# Patient Record
Sex: Male | Born: 1960 | Race: Black or African American | Hispanic: No | Marital: Single | Smoking: Never smoker
Health system: Southern US, Community
[De-identification: ages and names within clinical notes are randomized; demographics above are authoritative.]

## PROBLEM LIST (undated history)

## (undated) ENCOUNTER — Emergency Department: Admission: EM

## (undated) DIAGNOSIS — I10 Essential (primary) hypertension: Secondary | ICD-10-CM

## (undated) DIAGNOSIS — E119 Type 2 diabetes mellitus without complications: Secondary | ICD-10-CM

## (undated) DIAGNOSIS — E7800 Pure hypercholesterolemia, unspecified: Secondary | ICD-10-CM

## (undated) DIAGNOSIS — F79 Unspecified intellectual disabilities: Secondary | ICD-10-CM

## (undated) HISTORY — DX: Essential (primary) hypertension: I10

---

## 2013-05-16 ENCOUNTER — Ambulatory Visit: Payer: Self-pay | Admitting: Gastroenterology

## 2018-01-22 ENCOUNTER — Encounter

## 2018-01-22 ENCOUNTER — Encounter: Payer: Medicare Other | Attending: Family Medicine | Admitting: Nutrition

## 2018-01-22 ENCOUNTER — Encounter: Payer: Self-pay | Admitting: Nutrition

## 2018-01-22 VITALS — Ht 63.0 in | Wt 173.0 lb

## 2018-01-22 DIAGNOSIS — E1165 Type 2 diabetes mellitus with hyperglycemia: Secondary | ICD-10-CM | POA: Insufficient documentation

## 2018-01-22 DIAGNOSIS — I10 Essential (primary) hypertension: Secondary | ICD-10-CM | POA: Insufficient documentation

## 2018-01-22 DIAGNOSIS — E782 Mixed hyperlipidemia: Secondary | ICD-10-CM | POA: Diagnosis present

## 2018-01-22 DIAGNOSIS — E669 Obesity, unspecified: Secondary | ICD-10-CM | POA: Insufficient documentation

## 2018-01-22 DIAGNOSIS — E118 Type 2 diabetes mellitus with unspecified complications: Secondary | ICD-10-CM | POA: Insufficient documentation

## 2018-01-22 DIAGNOSIS — IMO0002 Reserved for concepts with insufficient information to code with codable children: Secondary | ICD-10-CM

## 2018-01-22 NOTE — Progress Notes (Signed)
  Medical Nutrition Therapy:  Appt start time: 1400 end time:  1500.   Assessment:  Primary concerns today: DM Type 2. Lives by himself. PMH: Hyperlipidemia, HTN Eats 3 meals per day. No transportation. Has an aide that helps him with meals and medications. Sees Dr. Durene Cal, [facility]. H/o possible TIA. Lantus 38 units a day- takes in am. , Glimepiride 4 mg a day. Testing once in morning. FBS 120-224 mg/dl. Last A1C 8.9%.  Admits to snacks between meals and eating sweets, misc junk food. Quit alcohol within the last year.  Current diet exceeds his needs contributing to elevated BS. Stays hungry between meals at times. Had 1 low  BS of 48 in December from skipping breakfast.  Willing to eat better to cut out snacks and improve BS. Marland Kitchen  Preferred Learning Style:   Auditory  Visual  Hands on  Learning Readiness:    Ready  Change in progress   MEDICATIONS:   DIETARY INTAKE:   24-hr recall:  B ( AM):  Eggs,-2  toast- 1  . Coffee plain Snk ( AM): pb sandwich 1 slice bread L ( PM):  String beans, potatoes, boiled chicken, water Snk ( PM): popcorn  D ( PM): cabbage, meatloaf, water Snk ( PM):  Beverages: water,   Usual physical activity: ADL   Estimated energy needs: 1800 calories 200  g carbohydrates 135 g protein 50 g fat  Progress Towards Goal(s):  In progress.   Nutritional Diagnosis:  NB-1.1 Food and nutrition-related knowledge deficit As related to DM Type .  As evidenced by A1C 8.9%.    Intervention:  Nutrition and Diabetes education provided on My Plate, CHO counting, meal planning, portion sizes, timing of meals, avoiding snacks between meals unless having a low blood sugar, target ranges for A1C and blood sugars, signs/symptoms and treatment of hyper/hypoglycemia, monitoring blood sugars, taking medications as prescribed, benefits of exercising 30 minutes per day and prevention of complications of DM. Marland Kitchen Goals 1. Follow MY Plate 2. Eat B) 6-8 am,   Lunch 12-2 pm  and Dinner 5-7 pm. Don't eat past 7 pm Cut out snacks between meals unless veggies.  Increase water intake  Get am BS less than 130 and Before bed less than 150 mg/dl. Walk daily Get A1C down to 7%   Teaching Method Utilized:  Visual Auditory Hands on  Handouts given during visit include:  The Plate Method   Meal Plan Card   Barriers to learning/adherence to lifestyle change: none  Demonstrated degree of understanding via:  Teach Back   Monitoring/Evaluation:  Dietary intake, exercise, , and body weight in 1 month(s). Recommend to consider stopping GLimperide- d/t weight gain and hunger/Low blood sugar and add Januvuia instead for better blood sugar control.

## 2018-01-22 NOTE — Patient Instructions (Addendum)
Goals 1. Follow MY Plate 2. Eat B) 6-8 am,   Lunch 12-2 pm and Dinner 5-7 pm. Don't eat past 7 pm Cut out snacks between meals unless veggies.  Increase water intake  Get am BS less than 130 and Before bed less than 150 mg/dl. Walk daily Get A1C down to 7%

## 2018-02-13 ENCOUNTER — Ambulatory Visit: Payer: Medicare Other | Admitting: Nutrition

## 2018-02-19 ENCOUNTER — Encounter: Payer: Medicare Other | Attending: Internal Medicine | Admitting: Nutrition

## 2018-02-19 VITALS — Ht 63.0 in | Wt 168.0 lb

## 2018-02-19 DIAGNOSIS — IMO0002 Reserved for concepts with insufficient information to code with codable children: Secondary | ICD-10-CM

## 2018-02-19 DIAGNOSIS — E118 Type 2 diabetes mellitus with unspecified complications: Secondary | ICD-10-CM | POA: Insufficient documentation

## 2018-02-19 DIAGNOSIS — E669 Obesity, unspecified: Secondary | ICD-10-CM | POA: Diagnosis present

## 2018-02-19 DIAGNOSIS — E1165 Type 2 diabetes mellitus with hyperglycemia: Secondary | ICD-10-CM

## 2018-02-19 NOTE — Progress Notes (Signed)
  Medical Nutrition Therapy:  Appt start time: 1000  end time:  1030  Assessment:  Primary concerns today: DM Type 2. Lives by himself. PMH: Hyperlipidemia, HTN Eats 3 meals per day. No transportation. Has an aide that helps him with meals and medications. Sees Dr. Durene Cal, [facility]. H/o possible TIA. Lantus 38 units a day- takes in am. , Glimepiride 4 mg a day.  Brought meter in but hasn't been testing according to the recall numbers..  Has been walking more often. Cut out snacks and junk food. Eating more fruits and vegetables. Some blood sugar readings in his meter but is different that what he wrote on his sheets. Only a few new numbers in his meter. Readjusted and corrected his times and dates on meter. He notes he is walking more. Lost 5 lbs.   Preferred Learning Style:   Auditory  Visual  Hands on  Learning Readiness:    Ready  Change in progress   MEDICATIONS:   DIETARY INTAKE:   24-hr recall:  B ( AM):  Eggs 2 boiled and 2 slice toast, water Snk ( AM) L ( PM): Cabbage, green beans, potatoes, sweet potato, chicken patty .  String beans, potatoes, boiled chicken, water Snk ( PM):  D ( PM): forgot what he ate. Beef., gravy, water Snk ( PM):  Beverages: water,   Usual physical activity: ADL   Estimated energy needs: 1800 calories 200  g carbohydrates 135 g protein 50 g fat  Progress Towards Goal(s):  In progress.   Nutritional Diagnosis:  NB-1.1 Food and nutrition-related knowledge deficit As related to DM Type .  As evidenced by A1C 8.9%.    Intervention:  Nutrition and Diabetes education provided on My Plate, CHO counting, meal planning, portion sizes, timing of meals, avoiding snacks between meals unless having a low blood sugar, target ranges for A1C and blood sugars, signs/symptoms and treatment of hyper/hypoglycemia, monitoring blood sugars, taking medications as prescribed, benefits of exercising 30 minutes per day and prevention of complications of  DM. Marland Kitchen Goals 1. Test blood in am and bedtime daily 2. Increase walking daily. 3. Increase vegetables and fresh fruits Get A1C done at next MD visit.  Teaching Method Utilized:  Visual Auditory Hands on  Handouts given during visit include:  The Plate Method   Meal Plan Card   Barriers to learning/adherence to lifestyle change: none  Demonstrated degree of understanding via:  Teach Back   Monitoring/Evaluation:  Dietary intake, exercise, , and body weight in 1 month(s). Recommend to consider stopping GLimperide- d/t weight gain and hunger/Low blood sugar and add Januvuia instead for better blood sugar control.

## 2018-02-19 NOTE — Patient Instructions (Signed)
Goals 1. Test blood in am and bedtime daily 2. Increase walking daily. 3. Increase vegetables and fresh fruits Get A1C done at next MD visit.

## 2018-02-28 ENCOUNTER — Encounter: Payer: Self-pay | Admitting: Nutrition

## 2018-05-08 ENCOUNTER — Encounter: Payer: Medicare Other | Attending: Internal Medicine | Admitting: Nutrition

## 2018-05-08 ENCOUNTER — Other Ambulatory Visit: Payer: Self-pay

## 2018-05-08 DIAGNOSIS — E1165 Type 2 diabetes mellitus with hyperglycemia: Secondary | ICD-10-CM | POA: Insufficient documentation

## 2018-05-08 DIAGNOSIS — IMO0002 Reserved for concepts with insufficient information to code with codable children: Secondary | ICD-10-CM

## 2018-05-08 DIAGNOSIS — E118 Type 2 diabetes mellitus with unspecified complications: Secondary | ICD-10-CM | POA: Insufficient documentation

## 2018-05-08 DIAGNOSIS — E782 Mixed hyperlipidemia: Secondary | ICD-10-CM | POA: Insufficient documentation

## 2018-05-08 NOTE — Progress Notes (Signed)
  Medical Nutrition Therapy:  Appt start time: 1000  end time:  1030  Assessment:  Primary concerns today: DM Type 2. FBS 238, 267, 269, 250 Evening: 383, 275, 263, 224, 280, 298. Got new meter. Accucheck Guide. Lantus 38 units Glimiperide. Has cut out sodas.  Drinks Water Eating 3 meals per day.  Has been eating fast foods. He has an aide that cooks for him. Needs better compliance with his food choices to improve his DM.   Preferred Learning Style:   Auditory  Visual  Hands on  Learning Readiness:    Ready  Change in progress   MEDICATIONS:   DIETARY INTAKE:   24-hr recall:  B ( AM):  Cherrios, with milk. Snk ( AM) L ( PM): Corn dog with bun, water, Snk ( PM):  D ( PM): Mashed potatoes, string beans, corn bread and beef,  Snk ( PM):  Beverages: water,   Usual physical activity: ADL   Estimated energy needs: 1800 calories 200  g carbohydrates 135 g protein 50 g fat  Progress Towards Goal(s):  In progress.   Nutritional Diagnosis:  NB-1.1 Food and nutrition-related knowledge deficit As related to DM Type .  As evidenced by A1C 8.9%.    Intervention:  Nutrition and Diabetes education provided on My Plate, CHO counting, meal planning, portion sizes, timing of meals, avoiding snacks between meals unless having a low blood sugar, target ranges for A1C and blood sugars, signs/symptoms and treatment of hyper/hypoglycemia, monitoring blood sugars, taking medications as prescribed, benefits of exercising 30 minutes per day and prevention of complications of DM. Marland Kitchen Goals Cut out fast foods  Increase fresh fruits and vegetables. Drink only water Follow My Plate as discussed Get labs done Call Dr. Fransico Him office and let them know your BS readings for insulin adjustment  Teaching Method Utilized:  Visual Auditory Hands on  Handouts given during visit include:  The Plate Method   Meal Plan Card   Barriers to learning/adherence to lifestyle change:  none  Demonstrated degree of understanding via:  Teach Back   Monitoring/Evaluation:  Dietary intake, exercise, , and body weight in 1 month(s).

## 2018-05-10 ENCOUNTER — Encounter: Payer: Self-pay | Admitting: Nutrition

## 2018-05-10 NOTE — Patient Instructions (Signed)
Goals Cut out fast foods  Increase fresh fruits and vegetables. Drink only water Follow My Plate as discussed Get labs done

## 2018-08-08 ENCOUNTER — Encounter: Payer: Medicare Other | Attending: Internal Medicine | Admitting: Nutrition

## 2018-08-08 ENCOUNTER — Other Ambulatory Visit: Payer: Self-pay

## 2018-08-08 NOTE — Progress Notes (Signed)
edical Nutrition Therapy:  Appt start time: 1216  end time:  1245  Assessment:  Primary concerns today: DM Type 2. Last A1C 8%. Has BP has been elevated. Admits to eating some porkl. Has been eating fried foods, BBQ Saw Dr. Yong Channel last week. At [facility] ([place]). Metformin 500 mg twice a day. Lantus 38 units, Glimperide 4 mg, His crestor increased to 40 mg a day according Was put on some new BP medicaine.  FBS: 159, 160, 173 and 213 mg/dl Drinking only water Walks for exercise. BP this am  142/84.  Needs a lot of reinforcement and coaching. He has a aide that is suppose to cook for him but she cooks what he wants or eat eats what processed foods he buys.  Preferred Learning Style:   Auditory  Visual  Hands on  Learning Readiness:    Ready  Change in progress   MEDICATIONS:   DIETARY INTAKE:   24-hr recall:  B ( AM):  viennnas sausage,  Snk ( AM) L ( PM): banana sandwich,  Water , Snk ( PM):  D ( PM):lima beans, corn, [place] wings,,  Snk ( PM):  Beverages: water,   Usual physical activity: ADL   Estimated energy needs: 1800 calories 200  g carbohydrates 135 g protein 50 g fat  Progress Towards Goal(s):  In progress.   Nutritional Diagnosis:  NB-1.1 Food and nutrition-related knowledge deficit As related to DM Type .  As evidenced by A1C 8.9%.    Intervention:  Nutrition and Diabetes education provided on My Plate, CHO counting, meal planning, portion sizes, timing of meals, avoiding snacks between meals unless having a low blood sugar, target ranges for A1C and blood sugars, signs/symptoms and treatment of hyper/hypoglycemia, monitoring blood sugars, taking medications as prescribed, benefits of exercising 30 minutes per day and prevention of complications of DM. Marland Kitchen Goals Cut out fast foods and avoid processed foods  Increase fresh fruits and vegetables. Drink only water Follow My Plate as discussed Get labs done Call Dr  office and let them know your BS readings  for insulin adjustment  Teaching Method Utilized:  Visual Auditory Hands on  Handouts given during visit include:  The Plate Method   Meal Plan Card   Barriers to learning/adherence to lifestyle change: none  Demonstrated degree of understanding via:  Teach Back   Monitoring/Evaluation:  Dietary intake, exercise, , and body weight in 3 month(s).

## 2018-09-06 ENCOUNTER — Encounter: Payer: Self-pay | Admitting: Nutrition

## 2018-09-06 NOTE — Patient Instructions (Addendum)
.   Goals Cut out fast foods and processed foods  Increase fresh fruits and vegetables. Drink only water Follow My Plate as discussed Get labs done Call Dr  office and let them know your BS readings for insulin adjustment

## 2018-11-06 ENCOUNTER — Encounter: Payer: Medicare Other | Attending: Internal Medicine | Admitting: Nutrition

## 2018-11-06 ENCOUNTER — Encounter: Payer: Self-pay | Admitting: Nutrition

## 2018-11-06 ENCOUNTER — Other Ambulatory Visit: Payer: Self-pay

## 2018-11-06 VITALS — Wt 177.0 lb

## 2018-11-06 DIAGNOSIS — E1165 Type 2 diabetes mellitus with hyperglycemia: Secondary | ICD-10-CM | POA: Diagnosis present

## 2018-11-06 DIAGNOSIS — IMO0002 Reserved for concepts with insufficient information to code with codable children: Secondary | ICD-10-CM

## 2018-11-06 DIAGNOSIS — E669 Obesity, unspecified: Secondary | ICD-10-CM | POA: Diagnosis present

## 2018-11-06 DIAGNOSIS — E118 Type 2 diabetes mellitus with unspecified complications: Secondary | ICD-10-CM | POA: Diagnosis not present

## 2018-11-06 DIAGNOSIS — E782 Mixed hyperlipidemia: Secondary | ICD-10-CM | POA: Diagnosis present

## 2018-11-06 NOTE — Patient Instructions (Signed)
Goals Cut out fast foods and avoid processed foods  Increase fresh fruits and vegetables. Drink only water Follow My Plate as discussed Get A1C done. Increase walking to 60 minutes a day Lose 1-2 lbs per week Get A1C down to 7%

## 2018-11-06 NOTE — Progress Notes (Signed)
edical Nutrition Therapy:  Appt start time: 1000  end time:  1015  Assessment:  Primary concerns today: DM Type 2. Last A1C 8% 12/19. No recent A1C. He notes he has cut out eating a lot of fried foods and fast foods. Metformin 500 mg twice a day. Lantus 38 units, Glimperide 4 mg, His crestor increased to 40 mg a day according to him last visit. Was put on some new BP medicaine.  FBS 130-180's. Drinking only water Walks for exercise.   Needs a lot of reinforcement and coaching. He has a aide that is suppose to cook for him but she cooks what he wants or eat eats what processed foods he buys.  He would benefit from an A1C level to better assess his BS control.   Preferred Learning Style:   Auditory  Visual  Hands on  Learning Readiness:    Ready  Change in progress   MEDICATIONS:   DIETARY INTAKE:   24-hr recall:  B ( AM):  Eggs and toast, Snk ( AM) L ( PM):bakled chicken, green beans and tuna salad, water , Snk ( PM):  D ( PM):tuna sandwich and fruit, water  Snk ( PM):  Beverages: water,   Usual physical activity: ADL   Estimated energy needs: 1800 calories 200  g carbohydrates 135 g protein 50 g fat  Progress Towards Goal(s):  In progress.   Nutritional Diagnosis:  NB-1.1 Food and nutrition-related knowledge deficit As related to DM Type .  As evidenced by A1C 8.9%.    Intervention:  Nutrition and Diabetes education provided on My Plate, CHO counting, meal planning, portion sizes, timing of meals, avoiding snacks between meals unless having a low blood sugar, target ranges for A1C and blood sugars, signs/symptoms and treatment of hyper/hypoglycemia, monitoring blood sugars, taking medications as prescribed, benefits of exercising 30 minutes per day and prevention of complications of DM. Marland Kitchen Goals Cut out fast foods and avoid processed foods  Increase fresh fruits and vegetables. Drink only water Follow My Plate as discussed Get A1C done. Increase  walking to 60 minutes a day Lose 1-2 lbs per week Get A1C down to 7%   Teaching Method Utilized:  Visual Auditory Hands on  Handouts given during visit include:  The Plate Method   Meal Plan Card   Barriers to learning/adherence to lifestyle change: none  Demonstrated degree of understanding via:  Teach Back   Monitoring/Evaluation:  Dietary intake, exercise, , and body weight in 3 month(s). He would benefit from A1C every 3 months due to his high risk comorbidities.

## 2019-02-07 ENCOUNTER — Encounter: Payer: Medicare HMO | Attending: Internal Medicine | Admitting: Nutrition

## 2019-02-07 ENCOUNTER — Other Ambulatory Visit: Payer: Self-pay

## 2019-02-07 ENCOUNTER — Encounter: Payer: Self-pay | Admitting: Nutrition

## 2019-02-07 VITALS — Ht 63.0 in | Wt 169.0 lb

## 2019-02-07 DIAGNOSIS — E669 Obesity, unspecified: Secondary | ICD-10-CM | POA: Diagnosis present

## 2019-02-07 DIAGNOSIS — I10 Essential (primary) hypertension: Secondary | ICD-10-CM | POA: Diagnosis present

## 2019-02-07 DIAGNOSIS — E1165 Type 2 diabetes mellitus with hyperglycemia: Secondary | ICD-10-CM | POA: Diagnosis present

## 2019-02-07 DIAGNOSIS — E118 Type 2 diabetes mellitus with unspecified complications: Secondary | ICD-10-CM | POA: Insufficient documentation

## 2019-02-07 DIAGNOSIS — IMO0002 Reserved for concepts with insufficient information to code with codable children: Secondary | ICD-10-CM

## 2019-02-07 DIAGNOSIS — E782 Mixed hyperlipidemia: Secondary | ICD-10-CM | POA: Diagnosis present

## 2019-02-07 NOTE — Patient Instructions (Signed)
Goals  Continue to eat more fresh fruits and vegetables. Cut out snacks Keep drinking water  Walk 30 minutes every day. Get A1C down to 7.5%. Get Wt down to 165 lbs.

## 2019-02-07 NOTE — Progress Notes (Signed)
edical Nutrition Therapy:  Appt start time: 0930 end time:  1000  Assessment:  Primary concerns today: DM Type 2. FBS 100-190's md/dl Bedtime low 161'W. DIdn't bring meter. Lantus 42 units Glimepiride 4 mg once a day. Has stop fast burgers and fries. He denies eating the wrong foods and says he is eating more vegetables. Getting food assistance from senior/school lunches. Last A1C reported to be 9.9% 12/2018.Marland Kitchen   Needs a lot of reinforcement and coaching. He has a aide that is suppose to cook for him but she cooks what he wants or eat eats what processed foods he buys.   Preferred Learning Style:   Auditory  Visual  Hands on  Learning Readiness:    Ready  Change in progress   MEDICATIONS:   DIETARY INTAKE:   24-hr recall:  B ( AM):  Eggs, Toast,  Snk ( AM) L ( PM) Green beans, water, corn bread, fruit cup. , Snk ( PM):  D ( PM): Bologna sandwich, water. Snk ( PM):  Beverages: water,   Usual physical activity: ADL   Estimated energy needs: 1800 calories 200  g carbohydrates 135 g protein 50 g fat  Progress Towards Goal(s):  In progress.   Nutritional Diagnosis:  NB-1.1 Food and nutrition-related knowledge deficit As related to DM Type .  As evidenced by A1C 8.9%.    Intervention:  Nutrition and Diabetes education provided on My Plate, CHO counting, meal planning, portion sizes, timing of meals, avoiding snacks between meals unless having a low blood sugar, target ranges for A1C and blood sugars, signs/symptoms and treatment of hyper/hypoglycemia, monitoring blood sugars, taking medications as prescribed, benefits of exercising 30 minutes per day and prevention of complications of DM. Marland Kitchen Goals  Continue to eat more fresh fruits and vegetables. Cut out snacks Keep drinking water  Walk 30 minutes every day. Get A1C down to 7.5%. Get Wt down to 165 lbs.  Teaching Method Utilized:  Visual Auditory Hands on  Handouts given during visit include:  The  Plate Method   Meal Plan Card   Barriers to learning/adherence to lifestyle change: none  Demonstrated degree of understanding via:  Teach Back   Monitoring/Evaluation:  Dietary intake, exercise, , and body weight in 3 month(s). He may benefit from Glipizide for better blood sugar control instead of Glimepiride.

## 2019-05-09 ENCOUNTER — Encounter: Payer: Medicare HMO | Attending: Internal Medicine | Admitting: Nutrition

## 2019-05-09 DIAGNOSIS — E669 Obesity, unspecified: Secondary | ICD-10-CM | POA: Insufficient documentation

## 2019-05-09 DIAGNOSIS — E782 Mixed hyperlipidemia: Secondary | ICD-10-CM | POA: Insufficient documentation

## 2019-05-09 DIAGNOSIS — E118 Type 2 diabetes mellitus with unspecified complications: Secondary | ICD-10-CM | POA: Insufficient documentation

## 2019-05-09 DIAGNOSIS — E1165 Type 2 diabetes mellitus with hyperglycemia: Secondary | ICD-10-CM | POA: Insufficient documentation

## 2019-05-09 DIAGNOSIS — I10 Essential (primary) hypertension: Secondary | ICD-10-CM | POA: Insufficient documentation

## 2019-06-04 ENCOUNTER — Other Ambulatory Visit: Payer: Self-pay

## 2019-06-04 ENCOUNTER — Emergency Department (HOSPITAL_COMMUNITY)
Admission: EM | Admit: 2019-06-04 | Discharge: 2019-06-04 | Disposition: A | Discharge status: Home / Self Care | Payer: Medicare HMO

## 2019-09-26 ENCOUNTER — Other Ambulatory Visit: Payer: Self-pay

## 2019-09-26 ENCOUNTER — Ambulatory Visit (INDEPENDENT_AMBULATORY_CARE_PROVIDER_SITE_OTHER): Payer: Medicare Other | Admitting: Internal Medicine

## 2019-09-26 ENCOUNTER — Encounter: Payer: Self-pay | Admitting: Internal Medicine

## 2019-09-26 ENCOUNTER — Ambulatory Visit (HOSPITAL_COMMUNITY)
Admission: RE | Admit: 2019-09-26 | Discharge: 2019-09-26 | Disposition: A | Discharge status: Home / Self Care | Payer: Medicare Other | Source: Ambulatory Visit | Attending: Internal Medicine | Admitting: Internal Medicine

## 2019-09-26 DIAGNOSIS — R0609 Other forms of dyspnea: Secondary | ICD-10-CM

## 2019-09-26 DIAGNOSIS — R0600 Dyspnea, unspecified: Secondary | ICD-10-CM | POA: Insufficient documentation

## 2019-09-26 NOTE — Progress Notes (Signed)
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   History of Present Illness  09/26/2019  Pulmonary/ 1st office eval/ Rachael Ferrie / [facility]  Chief Complaint  Patient presents with  . Consult    shortness of breath at rest  Dyspnea:  Lifting or bending over but can walk up to half a mile to store s stopping Cough: none  Sleep: lying flat with 2 pillows  SABA use: used 6h prior to OV  And did not bring it   No obvious day to day or daytime variability or assoc excess/ purulent sputum or mucus plugs or hemoptysis or cp or chest tightness, subjective wheeze or overt sinus or hb symptoms.   Sleeping ok without nocturnal  or early am exacerbation  of respiratory  c/o's or need for noct saba. Also denies any obvious fluctuation of symptoms with weather or environmental changes or other aggravating or alleviating factors except as outlined above   No unusual exposure hx or h/o childhood pna/ asthma or knowledge of premature birth.  Current Allergies, Complete Past Medical History, Past Surgical History, Family History, and Social History were reviewed in [employer] record.  ROS  The following are not active complaints unless bolded Hoarseness, sore throat, dysphagia, dental problems, itching, sneezing,  nasal congestion or discharge of excess mucus or purulent secretions, ear ache,   fever, chills, sweats, unintended wt loss or wt gain, classically pleuritic or exertional cp,  orthopnea pnd or arm/hand swelling  or  leg swelling, presyncope, palpitations, abdominal pain, anorexia, nausea, vomiting, diarrhea  or change in bowel habits or change in bladder habits, change in stools or change in urine, dysuria, hematuria,  rash, arthralgias, visual complaints, headache, numbness, weakness or ataxia or problems with walking or coordination,  change in mood or  memory.        Current Meds   Medication Sig  . albuterol (PROAIR HFA) 108 (90 Base) MCG/ACT inhaler   . amLODipine (NORVASC) 10 MG tablet Take 10 mg by mouth daily.  Marland Kitchen aspirin 325 MG tablet Take 325 mg by mouth daily.  . calcium-vitamin D (OSCAL WITH D) 500-200 MG-UNIT tablet Take 2 tablets by mouth. 1000 units a day; take one a day  . clopidogrel (PLAVIX) 75 MG tablet Take 75 mg by mouth daily.  . Cyanocobalamin (VITAMIN B 12) 250 MCG LOZG Take 250 mcg by mouth daily. 2 tablets daily.  Marland Kitchen glimepiride (AMARYL) 4 MG tablet Take 4 mg by mouth daily with breakfast.  . hydrochlorothiazide (HYDRODIURIL) 12.5 MG tablet Take 12.5 mg by mouth daily.  . insulin glargine (LANTUS) 100 UNIT/ML injection Inject 38 Units into the skin daily.  . metFORMIN (GLUCOPHAGE) 500 MG tablet Take 500 mg by mouth 2 (two) times daily with a meal.  . Multiple Vitamins-Minerals (THEREMS M PO) Take by mouth.  . OLMESARTAN MEDOXOMIL PO Take 20 mg by mouth. Take 1 1/2 daily  . ondansetron (ZOFRAN) 4  MG tablet Take 4 mg by mouth every 8 (eight) hours as needed for nausea or vomiting.  . rosuvastatin (CRESTOR) 20 MG tablet Take 20 mg by mouth daily.  . [DISCONTINUED] aspirin 81 MG tablet Take 81 mg by mouth daily.  . [DISCONTINUED] ranitidine (ZANTAC) 150 MG capsule Take 150 mg by mouth 2 (two) times daily.      Past Medical History:  Diagnosis Date  . Hypertension     Outpatient Medications Prior to Visit  Medication Sig Dispense Refill  . albuterol (PROAIR HFA) 108 (90 Base) MCG/ACT inhaler     . amLODipine (NORVASC) 10 MG tablet Take 10 mg by mouth daily.    Marland Kitchen aspirin 325 MG  tablet Take 325 mg by mouth daily.    . calcium-vitamin D (OSCAL WITH D) 500-200 MG-UNIT tablet Take 2 tablets by mouth. 1000 units a day; take one a day    . clopidogrel (PLAVIX) 75 MG tablet Take 75 mg by mouth daily.    . Cyanocobalamin (VITAMIN B 12) 250 MCG LOZG Take 250 mcg by mouth daily. 2 tablets daily.    Marland Kitchen glimepiride (AMARYL) 4 MG tablet Take 4 mg by mouth daily with breakfast.    . hydrochlorothiazide (HYDRODIURIL) 12.5 MG tablet Take 12.5 mg by mouth daily.    . insulin glargine (LANTUS) 100 UNIT/ML injection Inject 38 Units into the skin daily.    . metFORMIN (GLUCOPHAGE) 500 MG tablet Take 500 mg by mouth 2 (two) times daily with a meal.    . Multiple Vitamins-Minerals (THEREMS M PO) Take by mouth.    . OLMESARTAN MEDOXOMIL PO Take 20 mg by mouth. Take 1 1/2 daily    . ondansetron (ZOFRAN) 4 MG tablet Take 4 mg by mouth every 8 (eight) hours as needed for nausea or vomiting.    . ranitidine (ZANTAC) 150 MG capsule  no longer takng ? X 3 years     . rosuvastatin (CRESTOR) 20 MG tablet Take 20 mg by mouth daily.    Marland Kitchen aspirin 81 MG tablet Take 81 mg by mouth daily.    . Multiple Vitamins-Minerals (THEREMS-M) TABS 1 tablet daily.     No facility-administered medications prior to visit.     Objective:     BP 112/62 (BP Location: Left Arm, Cuff Size: Normal)   Pulse 82   Temp 97.6 F (36.4 C) (Other (Comment)) Comment (Src): wrist  Ht 5\' 3"  (1.6 m)   Wt 166 lb 3.2 oz (75.4 kg)   SpO2 97% Comment: Room air  BMI 29.44 kg/m   SpO2: 97 % (Room air)   amb bm with very flat affect    HEENT : pt wearing mask not removed for exam due to covid -19 concerns.    NECK :  without JVD/Nodes/TM/ nl carotid upstrokes bilaterally   LUNGS: no acc muscle use,  Nl contour chest with mildly reduced bs  bilaterally without wheezing and no cough on insp or exp maneuvers   CV:  RRR  no s3 or murmur or increase in P2, and no edema   ABD:  Obese soft  and nontender with nl inspiratory  excursion in the supine position. No bruits or organomegaly appreciated, bowel sounds nl  MS:  Nl gait/ ext warm without deformities, calf tenderness, cyanosis or clubbing No obvious joint restrictions   SKIN: warm and dry without lesions    NEURO:  alert, approp, nl sensorium with  no motor or cerebellar deficits apparent.    CXR  PA and Lateral:   09/26/2019 :    I personally reviewed images  impression as follows:   No hyperinflation/ mild T kyphosis    Labs ordered 09/26/2019  :  allergy profile  ? Went to lab?      Assessment   No problem-specific Assessment & Plan notes found for this encounter.     Sandrea Hughs, MD 09/26/2019

## 2019-09-26 NOTE — Patient Instructions (Addendum)
We will contact [facility] hospital for your CT scan and discharge summary from the last 3 years   Pantoprazole (protonix) 40 mg   Take  30-60 min before first meal of the day and Pepcid (famotidine)  20 mg one after supper until return to office - this is the best way to tell whether stomach acid is contributing to your problem.    GERD (REFLUX)  is an extremely common cause of respiratory symptoms just like yours , many times with no obvious heartburn at all.    It can be treated with medication, but also with lifestyle changes including elevation of the head of your bed (ideally with 6 -8inch blocks under the headboard of your bed),  Smoking cessation, avoidance of late meals, excessive alcohol, and avoid fatty foods, chocolate, peppermint, colas, red wine, and acidic juices such as orange juice.  NO MINT OR MENTHOL PRODUCTS SO NO COUGH DROPS  USE SUGARLESS CANDY INSTEAD (Jolley ranchers or Stover's or Life Savers) or even ice chips will also do - the key is to swallow to prevent all throat clearing. NO OIL BASED VITAMINS - use powdered substitutes.  Avoid fish oil when coughing.    Please remember to go to the lab and x-ray department at [facility]   for your tests - we will call you with the results when they are available.      Please schedule a follow up office visit in 6 weeks  with pfts on return (or first available slot)

## 2019-09-27 ENCOUNTER — Encounter: Payer: Self-pay | Admitting: Internal Medicine

## 2019-09-27 NOTE — Progress Notes (Signed)
Normal results, routed to normal result pool

## 2019-09-27 NOTE — Assessment & Plan Note (Addendum)
Onset in childhood  On prn inhaler since worse x 2018 - Allergy profile 09/26/2019 >  Eos 0. /  IgE  ? Went to lab -  09/26/2019   Walked RA  approx   700 ft  @ fast pace  stopped due to  End of study min sob, sats 100%      Symptoms are   disproportionate to objective findings and not clear to what extent this is actually a pulmonary  problem but pt does appear to have difficult to sort out respiratory symptoms of unknown origin for which  DDX  = almost all start with A and  include Adherence, Ace Inhibitors, Acid Reflux, Active Sinus Disease, Alpha 1 Antitripsin deficiency, Anxiety masquerading as Airways dz,  ABPA,  Allergy(esp in young), Aspiration (esp in elderly), Adverse effects of meds,  Active smoking or Vaping, A bunch of PE's/clot burden (a few small clots can't cause this syndrome unless there is already severe underlying pulm or vascular dz with poor reserve),  Anemia or thyroid disorder, plus two Bs  = Bronchiectasis and Beta blocker use..and one C= CHF   Adherence is always the initial "prime suspect" and is a multilayered concern that requires a "trust but verify" approach in every patient - starting with knowing how to use medications, especially inhalers, correctly, keeping up with refills and understanding the fundamental difference between maintenance and prns vs those medications only taken for a very short course and then stopped and not refilled.  - advised return with all meds in hand using a trust but verify approach to confirm accurate Medication  Reconciliation The principal here is that until we are certain that the  patients are doing what we've asked, it makes no sense to ask them to do more.    ? Acid (or non-acid) GERD > always difficult to exclude as up to 75% of pts in some series report no assoc GI/ Heartburn symptoms and he says  his symptoms started about the same time zantac taken off market and not replaced > rec max (24h)  acid suppression and diet restrictions/  reviewed and instructions given in writing.   ? Allergy /asthma reported since childhood but nothing to suggest at present other than reported dep on saba  Re saba advised: I spent extra time with pt today reviewing appropriate use of albuterol for prn use on exertion with the following points: 1) saba is for relief of sob that does not improve by walking a slower pace or resting but rather if the pt does not improve after trying this first. 2) If the pt is convinced, as many are, that saba helps recover from activity faster then it's easy to tell if this is the case by re-challenging : ie stop, take the inhaler, then p 5 minutes try the exact same activity (intensity of workload) that just caused the symptoms and see if they are substantially diminished or not after saba 3) if there is an activity that reproducibly causes the symptoms, try the saba 15 min before the activity on alternate days   If in fact the saba really does help, then fine to continue to use it prn but advised may need to look closer at the maintenance regimen being used to achieve better control of airways disease with exertion.    ? Anxiety/conditioing  > usually at the bottom of this list of usual suspects but should be much higher on this pt's based on Hx  (? Schizophrenic?)  and P  and note already on psychotropics and may interfere with adherence and also interpretation of response or lack thereof to symptom management which can be quite subjective.  ? Anemia/ thyroid disorder > need labs  ? A bunch of PE's > need w/u from [facility] for "suspected PE"  req   ? chf > says cards eval done and nl prior to referral to pulmonary but need to verify     Medical decision making was a high level of complexity in this case because of  A refractory  chronic conditions /diagnosis with extensive ddx  requiring extra time for  H and P, chart review, counseling,  directly observing portions of ambulatory 02 saturation study/    and  generating customized AVS unique to this office visit and charting > total time 62 min   Each maintenance medication was reviewed in detail including emphasizing most importantly the difference between maintenance and prns and under what circumstances the prns are to be triggered using an action plan format where appropriate. Please see avs for details which were reviewed in writing by both me and my nurse and patient given a written copy highlighted where appropriate with yellow highlighter for the patient's continued care at home along with an updated version of their medications.  Patient was asked to maintain medication reconciliation by comparing this list to the actual medications being used at home and to contact this office right away if there is a conflict or discrepancy.

## 2019-11-01 ENCOUNTER — Other Ambulatory Visit: Payer: Self-pay

## 2019-11-01 ENCOUNTER — Other Ambulatory Visit (HOSPITAL_COMMUNITY)
Admission: RE | Admit: 2019-11-01 | Discharge: 2019-11-01 | Disposition: A | Discharge status: Home / Self Care | Payer: Medicare Other | Source: Ambulatory Visit | Attending: Internal Medicine | Admitting: Internal Medicine

## 2019-11-01 DIAGNOSIS — Z20822 Contact with and (suspected) exposure to covid-19: Secondary | ICD-10-CM | POA: Diagnosis not present

## 2019-11-01 DIAGNOSIS — Z01812 Encounter for preprocedural laboratory examination: Secondary | ICD-10-CM | POA: Diagnosis present

## 2019-11-01 LAB — SARS CORONAVIRUS 2 (TAT 6-24 HRS): SARS Coronavirus 2: NEGATIVE

## 2019-11-05 ENCOUNTER — Other Ambulatory Visit: Payer: Self-pay

## 2019-11-05 ENCOUNTER — Ambulatory Visit (HOSPITAL_COMMUNITY)
Admission: RE | Admit: 2019-11-05 | Discharge: 2019-11-05 | Disposition: A | Discharge status: Home / Self Care | Payer: Medicare Other | Source: Ambulatory Visit | Attending: Internal Medicine | Admitting: Internal Medicine

## 2019-11-05 DIAGNOSIS — R0600 Dyspnea, unspecified: Secondary | ICD-10-CM | POA: Insufficient documentation

## 2019-11-05 DIAGNOSIS — R0609 Other forms of dyspnea: Secondary | ICD-10-CM

## 2019-11-05 LAB — PULMONARY FUNCTION TEST
DL/VA % pred: 113 %
DL/VA: 4.92 ml/min/mmHg/L
DLCO unc % pred: 94 %
DLCO unc: 21.37 ml/min/mmHg
FEF 25-75 Post: 2.24 L/sec
FEF 25-75 Pre: 2.76 L/sec
FEF2575-%Change-Post: -18 %
FEF2575-%Pred-Post: 96 %
FEF2575-%Pred-Pre: 118 %
FEV1-%Change-Post: -6 %
FEV1-%Pred-Post: 85 %
FEV1-%Pred-Pre: 91 %
FEV1-Post: 2.08 L
FEV1-Pre: 2.21 L
FEV1FVC-%Change-Post: -7 %
FEV1FVC-%Pred-Pre: 109 %
FEV6-%Change-Post: 2 %
FEV6-%Pred-Post: 88 %
FEV6-%Pred-Pre: 85 %
FEV6-Post: 2.64 L
FEV6-Pre: 2.58 L
FEV6FVC-%Pred-Post: 104 %
FEV6FVC-%Pred-Pre: 104 %
FVC-%Change-Post: 1 %
FVC-%Pred-Post: 84 %
FVC-%Pred-Pre: 82 %
FVC-Post: 2.64 L
FVC-Pre: 2.59 L
Post FEV1/FVC ratio: 79 %
Post FEV6/FVC ratio: 100 %
Pre FEV1/FVC ratio: 85 %
Pre FEV6/FVC Ratio: 100 %
RV % pred: 94 %
RV: 1.83 L
TLC % pred: 83 %
TLC: 4.81 L

## 2019-11-05 MED ORDER — ALBUTEROL SULFATE (2.5 MG/3ML) 0.083% IN NEBU
2.5000 mg | INHALATION_SOLUTION | Freq: Once | RESPIRATORY_TRACT | Status: AC
  Administered 2019-11-05: 2.5 mg via RESPIRATORY_TRACT

## 2019-11-07 ENCOUNTER — Ambulatory Visit: Payer: Medicare Other | Admitting: Internal Medicine

## 2019-11-12 ENCOUNTER — Other Ambulatory Visit: Payer: Self-pay | Admitting: Acute Care

## 2019-11-12 DIAGNOSIS — I639 Cerebral infarction, unspecified: Secondary | ICD-10-CM

## 2019-11-26 ENCOUNTER — Ambulatory Visit
Admission: RE | Admit: 2019-11-26 | Discharge: 2019-11-26 | Disposition: A | Discharge status: Home / Self Care | Payer: Medicare Other | Source: Ambulatory Visit | Attending: Acute Care | Admitting: Acute Care

## 2019-11-26 ENCOUNTER — Other Ambulatory Visit: Payer: Self-pay

## 2019-11-26 DIAGNOSIS — I639 Cerebral infarction, unspecified: Secondary | ICD-10-CM | POA: Diagnosis present

## 2020-01-30 ENCOUNTER — Other Ambulatory Visit: Payer: Medicare Other

## 2020-01-31 ENCOUNTER — Other Ambulatory Visit
Admission: RE | Admit: 2020-01-31 | Discharge: 2020-01-31 | Disposition: A | Discharge status: Home / Self Care | Payer: Medicare Other | Source: Ambulatory Visit | Attending: Gastroenterology | Admitting: Gastroenterology

## 2020-01-31 ENCOUNTER — Other Ambulatory Visit: Payer: Self-pay

## 2020-01-31 DIAGNOSIS — Z20822 Contact with and (suspected) exposure to covid-19: Secondary | ICD-10-CM | POA: Insufficient documentation

## 2020-01-31 DIAGNOSIS — Z01812 Encounter for preprocedural laboratory examination: Secondary | ICD-10-CM | POA: Diagnosis present

## 2020-01-31 LAB — SARS CORONAVIRUS 2 (TAT 6-24 HRS): SARS Coronavirus 2: NEGATIVE

## 2020-03-13 ENCOUNTER — Other Ambulatory Visit
Admission: RE | Admit: 2020-03-13 | Discharge: 2020-03-13 | Disposition: A | Discharge status: Home / Self Care | Payer: Medicare HMO | Source: Ambulatory Visit | Attending: Gastroenterology | Admitting: Gastroenterology

## 2020-03-13 ENCOUNTER — Other Ambulatory Visit: Payer: Self-pay

## 2020-03-13 DIAGNOSIS — Z20822 Contact with and (suspected) exposure to covid-19: Secondary | ICD-10-CM | POA: Insufficient documentation

## 2020-03-13 DIAGNOSIS — Z01812 Encounter for preprocedural laboratory examination: Secondary | ICD-10-CM | POA: Diagnosis present

## 2020-03-13 LAB — SARS CORONAVIRUS 2 (TAT 6-24 HRS): SARS Coronavirus 2: NEGATIVE

## 2020-03-17 ENCOUNTER — Ambulatory Visit: Payer: Medicare HMO | Admitting: Certified Registered"

## 2020-03-17 ENCOUNTER — Encounter
Admission: RE | Disposition: A | Discharge status: Home / Self Care | Payer: Self-pay | Source: Ambulatory Visit | Attending: Gastroenterology

## 2020-03-17 ENCOUNTER — Ambulatory Visit
Admission: RE | Admit: 2020-03-17 | Discharge: 2020-03-17 | Disposition: A | Discharge status: Home / Self Care | Payer: Medicare HMO | Source: Ambulatory Visit | Attending: Gastroenterology | Admitting: Gastroenterology

## 2020-03-17 DIAGNOSIS — Z888 Allergy status to other drugs, medicaments and biological substances status: Secondary | ICD-10-CM | POA: Insufficient documentation

## 2020-03-17 DIAGNOSIS — Z794 Long term (current) use of insulin: Secondary | ICD-10-CM | POA: Diagnosis not present

## 2020-03-17 DIAGNOSIS — Z7902 Long term (current) use of antithrombotics/antiplatelets: Secondary | ICD-10-CM | POA: Insufficient documentation

## 2020-03-17 DIAGNOSIS — Z1211 Encounter for screening for malignant neoplasm of colon: Secondary | ICD-10-CM | POA: Insufficient documentation

## 2020-03-17 DIAGNOSIS — I10 Essential (primary) hypertension: Secondary | ICD-10-CM | POA: Diagnosis not present

## 2020-03-17 DIAGNOSIS — K640 First degree hemorrhoids: Secondary | ICD-10-CM | POA: Diagnosis not present

## 2020-03-17 DIAGNOSIS — Z7982 Long term (current) use of aspirin: Secondary | ICD-10-CM | POA: Diagnosis not present

## 2020-03-17 DIAGNOSIS — E119 Type 2 diabetes mellitus without complications: Secondary | ICD-10-CM | POA: Insufficient documentation

## 2020-03-17 DIAGNOSIS — K5730 Diverticulosis of large intestine without perforation or abscess without bleeding: Secondary | ICD-10-CM | POA: Diagnosis not present

## 2020-03-17 DIAGNOSIS — I2510 Atherosclerotic heart disease of native coronary artery without angina pectoris: Secondary | ICD-10-CM | POA: Insufficient documentation

## 2020-03-17 DIAGNOSIS — Z79899 Other long term (current) drug therapy: Secondary | ICD-10-CM | POA: Diagnosis not present

## 2020-03-17 DIAGNOSIS — Z7984 Long term (current) use of oral hypoglycemic drugs: Secondary | ICD-10-CM | POA: Insufficient documentation

## 2020-03-17 HISTORY — PX: COLONOSCOPY WITH PROPOFOL: SHX5780

## 2020-03-17 HISTORY — DX: Type 2 diabetes mellitus without complications: E11.9

## 2020-03-17 HISTORY — DX: Pure hypercholesterolemia, unspecified: E78.00

## 2020-03-17 LAB — GLUCOSE, CAPILLARY: Glucose-Capillary: 190 mg/dL — ABNORMAL HIGH (ref 70–99)

## 2020-03-17 SURGERY — COLONOSCOPY WITH PROPOFOL
Anesthesia: General

## 2020-03-17 MED ORDER — SODIUM CHLORIDE 0.9 % IV SOLN: INTRAVENOUS | Status: DC

## 2020-03-17 MED ORDER — PROPOFOL 10 MG/ML IV BOLUS
INTRAVENOUS | Status: DC | PRN
  Administered 2020-03-17: 80 mg via INTRAVENOUS

## 2020-03-17 MED ORDER — PROPOFOL 500 MG/50ML IV EMUL
INTRAVENOUS | Status: DC | PRN
  Administered 2020-03-17: 150 ug/kg/min via INTRAVENOUS

## 2020-03-17 NOTE — Op Note (Signed)
[facility] Gastroenterology Patient Name: Justin Vasquez Procedure Date: 03/17/2020 2:33 PM MRN: [id number] Account #: [id number] Date of Birth: 1960-11-11 Admit Type: Outpatient Age: 60 Room: [facility] ENDO ROOM 1 Gender: Male Note Status: Finalized Procedure:             Colonoscopy Indications:           Screening for colorectal malignant neoplasm Providers:             Andrey Farmer MD, MD Referring MD:          Abran Richard (Referring MD) Medicines:             Monitored Anesthesia Care Complications:         No immediate complications. Procedure:             Pre-Anesthesia Assessment:                        - Prior to the procedure, a History and Physical was                         performed, and patient medications and allergies were                         reviewed. The patient is competent. The risks and                         benefits of the procedure and the sedation options and                         risks were discussed with the patient. All questions                         were answered and informed consent was obtained.                         Patient identification and proposed procedure were                         verified by the physician, the nurse, the anesthetist                         and the technician in the endoscopy suite. Mental                         Status Examination: alert and oriented. Airway                         Examination: normal oropharyngeal airway and neck                         mobility. Respiratory Examination: clear to                         auscultation. CV Examination: normal. Prophylactic                         Antibiotics: The patient does not require prophylactic  antibiotics. Prior Anticoagulants: The patient has                         taken Plavix (clopidogrel), last dose was 5 days prior                         to procedure. ASA Grade Assessment: II - A patient                          with mild systemic disease. After reviewing the risks                         and benefits, the patient was deemed in satisfactory                         condition to undergo the procedure. The anesthesia                         plan was to use monitored anesthesia care (MAC).                         Immediately prior to administration of medications,                         the patient was re-assessed for adequacy to receive                         sedatives. The heart rate, respiratory rate, oxygen                         saturations, blood pressure, adequacy of pulmonary                         ventilation, and response to care were monitored                         throughout the procedure. The physical status of the                         patient was re-assessed after the procedure.                        After obtaining informed consent, the colonoscope was                         passed under direct vision. Throughout the procedure,                         the patient's blood pressure, pulse, and oxygen                         saturations were monitored continuously. The                         Colonoscope was introduced through the anus and                         advanced to the the terminal ileum. The colonoscopy  was performed without difficulty. The patient                         tolerated the procedure well. The quality of the bowel                         preparation was good. Findings:      The perianal and digital rectal examinations were normal.      The terminal ileum appeared normal.      A few small-mouthed diverticula were found in the sigmoid colon,       descending colon and ascending colon.      Non-bleeding internal hemorrhoids were found during retroflexion. The       hemorrhoids were Grade I (internal hemorrhoids that do not prolapse).      The exam was otherwise without abnormality on direct and retroflexion        views. Impression:            - The examined portion of the ileum was normal.                        - Diverticulosis in the sigmoid colon, in the                         descending colon and in the ascending colon.                        - Non-bleeding internal hemorrhoids.                        - The examination was otherwise normal on direct and                         retroflexion views.                        - No specimens collected. Recommendation:        - Discharge patient to home.                        - Resume previous diet.                        - Continue present medications.                        - Resume Plavix (clopidogrel) at prior dose today.                        - Repeat colonoscopy in 10 years for screening                         purposes.                        - Return to referring physician as previously                         scheduled. Procedure Code(s):     --- Professional ---  I2979, Colorectal cancer screening; colonoscopy on                         individual not meeting criteria for high risk Diagnosis Code(s):     --- Professional ---                        Z12.11, Encounter for screening for malignant neoplasm                         of colon                        K64.0, First degree hemorrhoids                        K57.30, Diverticulosis of large intestine without                         perforation or abscess without bleeding CPT copyright 2019 [employer]. All rights reserved. The codes documented in this report are preliminary and upon coder review may  be revised to meet current compliance requirements. Andrey Farmer MD, MD 03/17/2020 3:01:05 PM Number of Addenda: 0 Note Initiated On: 03/17/2020 2:33 PM Scope Withdrawal Time: 0 hours 7 minutes 26 seconds  Total Procedure Duration: 0 hours 11 minutes 38 seconds  Estimated Blood Loss:  Estimated blood loss: none.      [facility]

## 2020-03-17 NOTE — Transfer of Care (Signed)
Immediate Anesthesia Transfer of Care Note  Patient: Justin Vasquez  Procedure(s) Performed: COLONOSCOPY WITH PROPOFOL (N/A )  Patient Location: PACU and Endoscopy Unit  Anesthesia Type:General  Level of Consciousness: drowsy  Airway & Oxygen Therapy: Patient Spontanous Breathing  Post-op Assessment: Report given to RN  Post vital signs: stable  Last Vitals:  Vitals Value Taken Time  BP    Temp    Pulse    Resp    SpO2      Last Pain:  Vitals:   03/17/20 1325  TempSrc: Temporal  PainSc: 0-No pain         Complications: No complications documented.

## 2020-03-17 NOTE — H&P (Signed)
Outpatient short stay form Pre-procedure 03/17/2020 2:30 PM Justin Lot MD, MPH  Primary Physician: Dr. Durene Cal  Reason for visit:  Screening  History of present illness:   60 y/o gentleman with history of CAD, DM II, and hypertension here for screening colonoscopy. No family history of GI malignancies. No significant abdominal surgeries. Takes plavix with last dose 5 days ago. No significant GI symptoms.    Current Facility-Administered Medications:  .  0.9 %  sodium chloride infusion, , Intravenous, Continuous, Locklear, Rossie Muskrat, MD, Last Rate: 20 mL/hr at 03/17/20 1346, New Bag at 03/17/20 1346  Medications Prior to Admission  Medication Sig Dispense Refill Last Dose  . carvedilol (COREG) 12.5 MG tablet Take 12.5 mg by mouth 2 (two) times daily with a meal.   03/10/2020  . fluticasone (FLONASE) 50 MCG/ACT nasal spray Place into both nostrils daily.   Past Month at Unknown time  . insulin glargine (LANTUS) 100 UNIT/ML injection Inject 38 Units into the skin daily.   03/17/2020  . pantoprazole (PROTONIX) 40 MG tablet Take 40 mg by mouth daily.   03/10/2020  . ranitidine (ZANTAC) 150 MG capsule Take 150 mg by mouth 2 (two) times daily.   03/10/2020  . albuterol (VENTOLIN HFA) 108 (90 Base) MCG/ACT inhaler    03/09/2020  . amLODipine (NORVASC) 10 MG tablet Take 10 mg by mouth daily.   03/10/2020  . aspirin 325 MG tablet Take 325 mg by mouth daily.   03/10/2020  . calcium-vitamin D (OSCAL WITH D) 500-200 MG-UNIT tablet Take 2 tablets by mouth. 1000 units a day; take one a day   03/10/2020  . clopidogrel (PLAVIX) 75 MG tablet Take 75 mg by mouth daily.   03/10/2020  . Cyanocobalamin (VITAMIN B 12) 250 MCG LOZG Take 250 mcg by mouth daily. 2 tablets daily.   03/10/2020  . glimepiride (AMARYL) 4 MG tablet Take 4 mg by mouth daily with breakfast.   03/10/2020  . hydrochlorothiazide (HYDRODIURIL) 12.5 MG tablet Take 12.5 mg by mouth daily.   03/10/2020  . metFORMIN (GLUCOPHAGE) 500 MG tablet Take 500 mg by  mouth 2 (two) times daily with a meal.   03/10/2020  . Multiple Vitamins-Minerals (THEREMS M PO) Take by mouth.   03/10/2020  . OLMESARTAN MEDOXOMIL PO Take 20 mg by mouth. Take 1 1/2 daily   03/10/2020  . ondansetron (ZOFRAN) 4 MG tablet Take 4 mg by mouth every 8 (eight) hours as needed for nausea or vomiting.   03/10/2020  . rosuvastatin (CRESTOR) 20 MG tablet Take 20 mg by mouth daily.   03/10/2020     Allergies  Allergen Reactions  . Lisinopril-Hydrochlorothiazide Anaphylaxis     Past Medical History:  Diagnosis Date  . Diabetes mellitus without complication (HCC)   . Hypercholesterolemia   . Hypertension     Review of systems:  Otherwise negative.    Physical Exam  Gen: Alert, oriented. Appears stated age.  HEENT: PERRLA. Lungs: No respiratory distress CV: RRR Abd: soft, benign, no masses Ext: No edema    Planned procedures: Proceed with colonoscopy. The patient understands the nature of the planned procedure, indications, risks, alternatives and potential complications including but not limited to bleeding, infection, perforation, damage to internal organs and possible oversedation/side effects from anesthesia. The patient agrees and gives consent to proceed.  Please refer to procedure notes for findings, recommendations and patient disposition/instructions.     Justin Lot MD, MPH Gastroenterology 03/17/2020  2:30 PM

## 2020-03-17 NOTE — Anesthesia Preprocedure Evaluation (Addendum)
Anesthesia Evaluation  Patient identified by MRN, date of birth, ID band Patient awake    Reviewed: Allergy & Precautions, NPO status , Patient's Chart, lab work & pertinent test results, reviewed documented beta blocker date and time   Airway Mallampati: II  TM Distance: >3 FB     Dental  (+) Teeth Intact   Pulmonary COPD,    Pulmonary exam normal        Cardiovascular hypertension, Pt. on medications and Pt. on home beta blockers + DOE  Normal cardiovascular exam+ Valvular Problems/Murmurs      Neuro/Psych TIAnegative psych ROS   GI/Hepatic Neg liver ROS, GERD  Medicated and Controlled,  Endo/Other  diabetes  Renal/GU negative Renal ROS  negative genitourinary   Musculoskeletal negative musculoskeletal ROS (+)   Abdominal Normal abdominal exam  (+)   Peds negative pediatric ROS (+)  Hematology negative hematology ROS (+)   Anesthesia Other Findings Past Medical History: No date: Diabetes mellitus without complication (HCC) No date: Hypercholesterolemia No date: Hypertension  Reproductive/Obstetrics                            Anesthesia Physical Anesthesia Plan  ASA: III  Anesthesia Plan: General   Post-op Pain Management:    Induction: Intravenous  PONV Risk Score and Plan:   Airway Management Planned: Nasal Cannula  Additional Equipment:   Intra-op Plan:   Post-operative Plan:   Informed Consent: I have reviewed the patients History and Physical, chart, labs and discussed the procedure including the risks, benefits and alternatives for the proposed anesthesia with the patient or authorized representative who has indicated his/her understanding and acceptance.     Dental advisory given  Plan Discussed with: CRNA and Surgeon  Anesthesia Plan Comments:         Anesthesia Quick Evaluation

## 2020-03-17 NOTE — Interval H&P Note (Signed)
History and Physical Interval Note:  03/17/2020 2:32 PM  Justin Vasquez  has presented today for surgery, with the diagnosis of SCREENING.  The various methods of treatment have been discussed with the patient and family. After consideration of risks, benefits and other options for treatment, the patient has consented to  Procedure(s): COLONOSCOPY WITH PROPOFOL (N/A) as a surgical intervention.  The patient's history has been reviewed, patient examined, no change in status, stable for surgery.  I have reviewed the patient's chart and labs.  Questions were answered to the patient's satisfaction.     Regis Bill  Ok to proceed with colonoscopy

## 2020-03-18 ENCOUNTER — Encounter: Payer: Self-pay | Admitting: Gastroenterology

## 2020-03-18 NOTE — Anesthesia Postprocedure Evaluation (Signed)
Anesthesia Post Note  Patient: Justin Vasquez  Procedure(s) Performed: COLONOSCOPY WITH PROPOFOL (N/A )  Patient location during evaluation: Endoscopy Anesthesia Type: General Level of consciousness: awake and alert and oriented Pain management: pain level controlled Vital Signs Assessment: post-procedure vital signs reviewed and stable Respiratory status: spontaneous breathing Cardiovascular status: blood pressure returned to baseline Anesthetic complications: no   No complications documented.   Last Vitals:  Vitals:   03/17/20 1500 03/17/20 1518  BP:  (!) 142/91  Pulse:    Resp:    Temp: (!) 36.1 C   SpO2:      Last Pain:  Vitals:   03/17/20 1530  TempSrc:   PainSc: 0-No pain                 Lorijean Husser

## 2021-10-14 IMAGING — DX DG CHEST 2V
2 series · 2 of 2 positions shown · non-contrast
Comparison: 09/21/2019

CLINICAL DATA: Shortness of breath

EXAM:
CHEST - 2 VIEW

[chest pa]
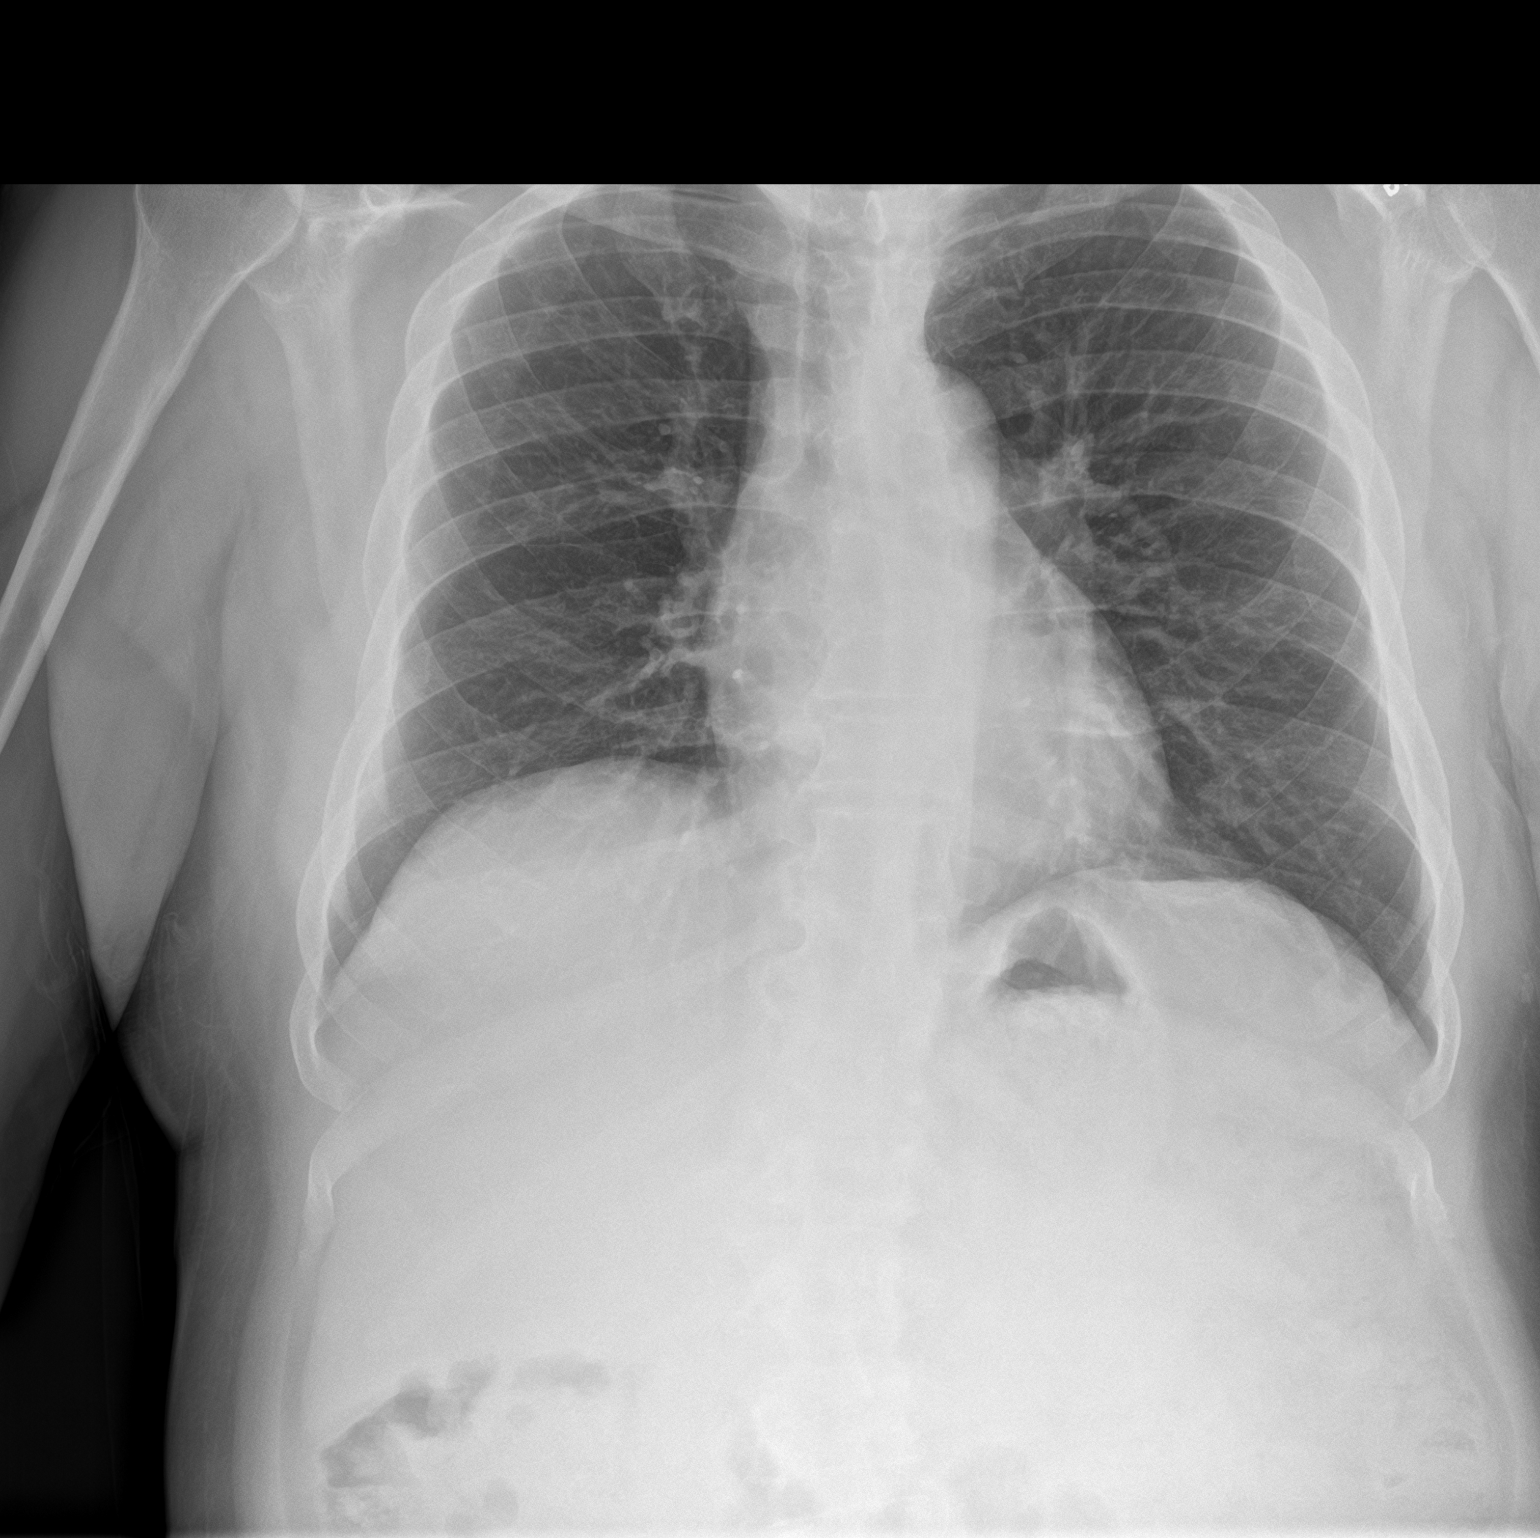

[chest lat]
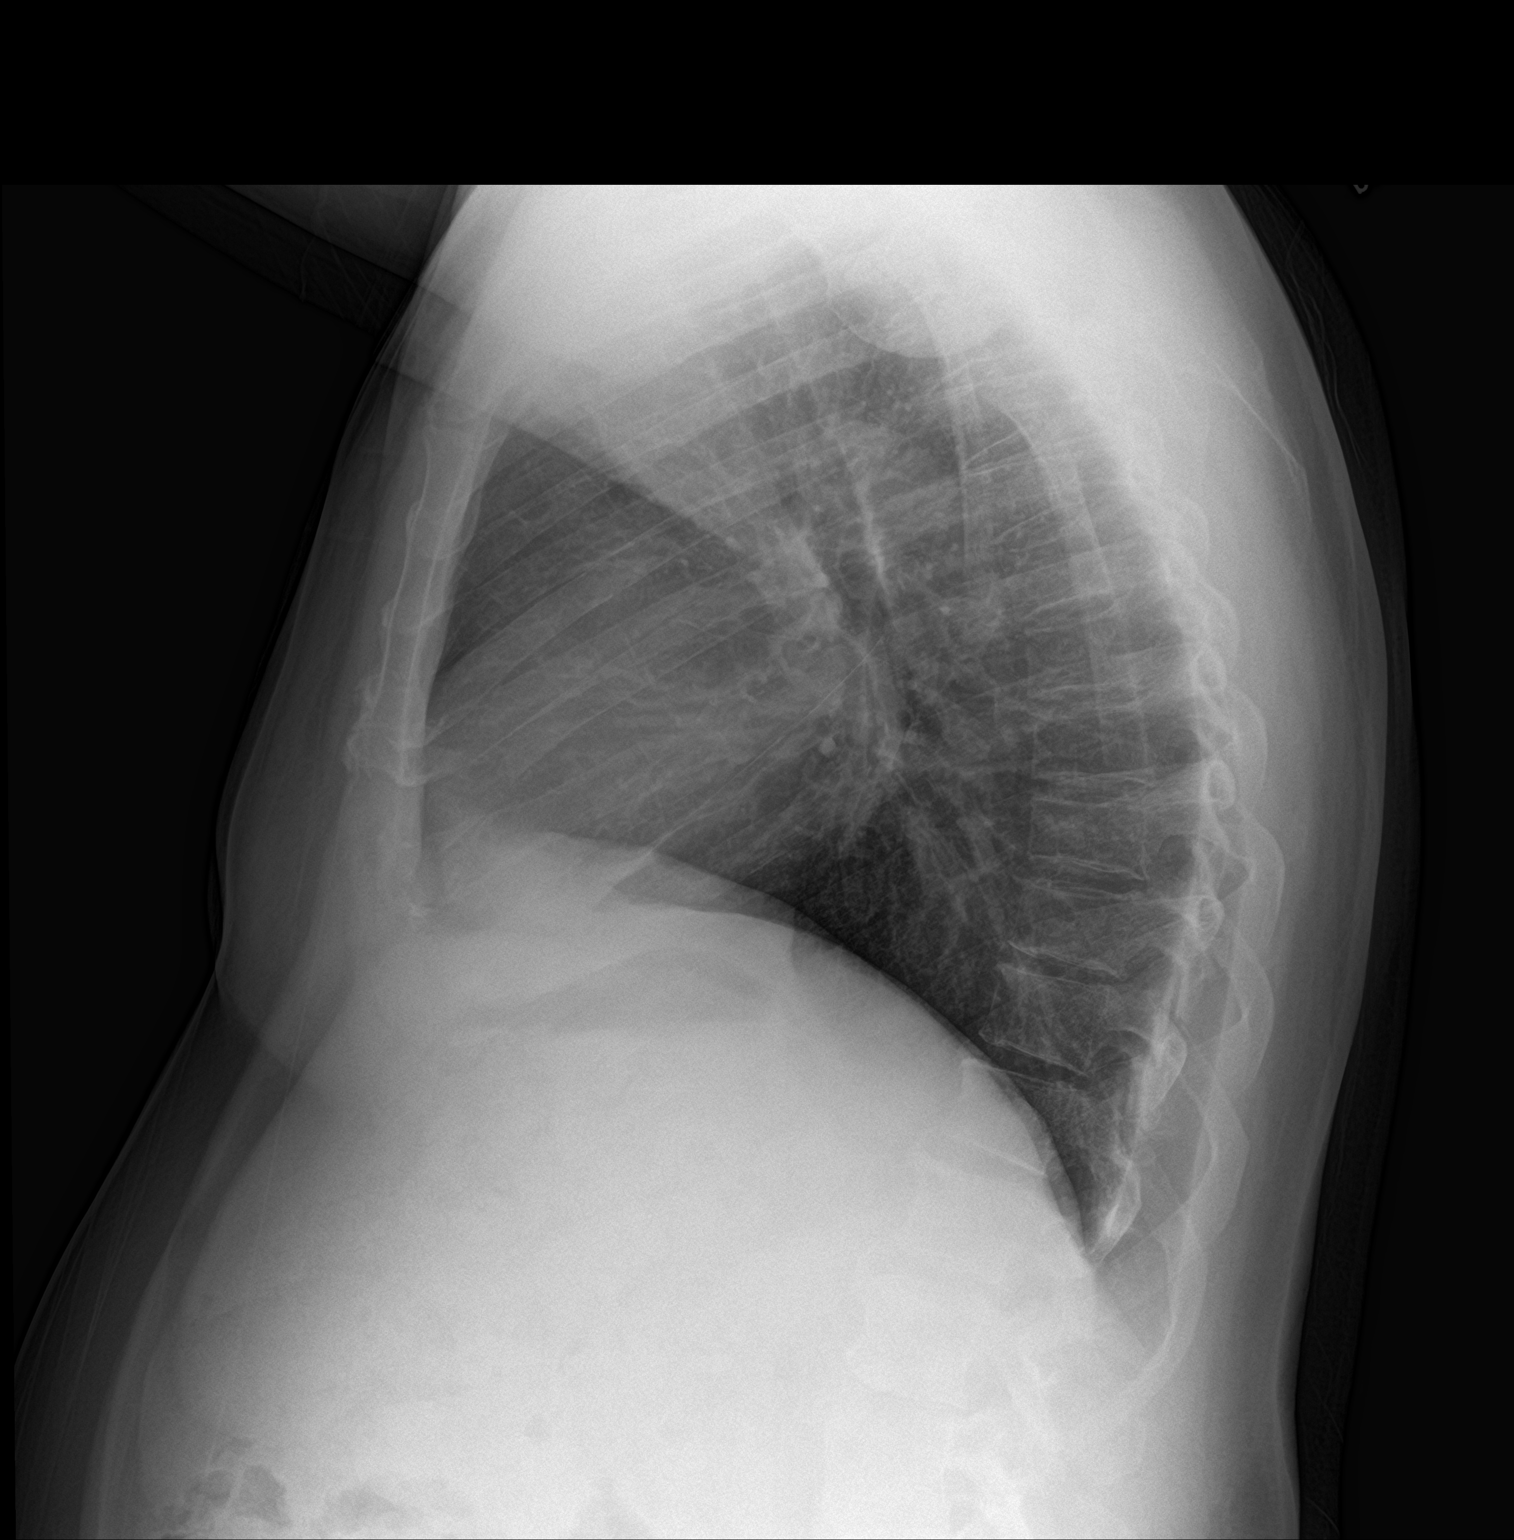

[2 of 2 positions shown; findings below may reference images not displayed]

FINDINGS: Heart and mediastinal contours are within normal limits. No focal
opacities or effusions. No acute bony abnormality.
IMPRESSION: Normal study.

## 2021-12-14 IMAGING — MR MR HEAD W/O CM
11 series · 44 of 48 positions shown · non-contrast
Comparison: None.

CLINICAL DATA: Facial droop and right hand tingling with difficulty
speaking.

EXAM:
MRI HEAD WITHOUT CONTRAST
TECHNIQUE: Multiplanar, multiecho pulse sequences of the brain and surrounding
structures were obtained without intravenous contrast.

[Series 5: ax dwi_tracew · axial · 3.0mm · 0.60mm/px · z∈[-59,+90]mm · 4 of 48 slices shown]
[im 1/48]
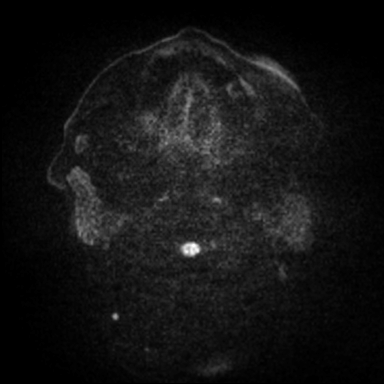
[im 16/48]
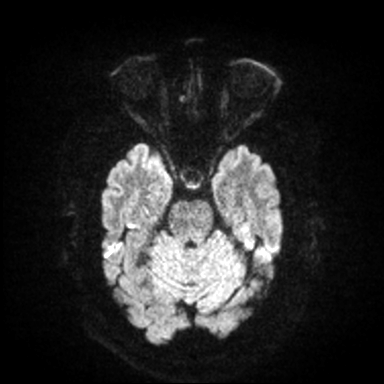
[im 32/48]
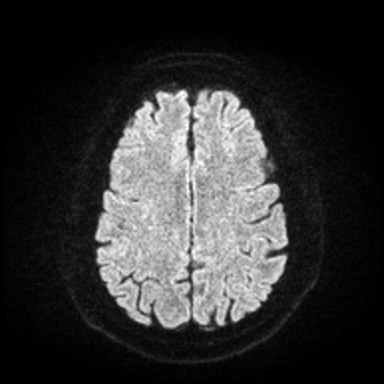
[im 48/48]
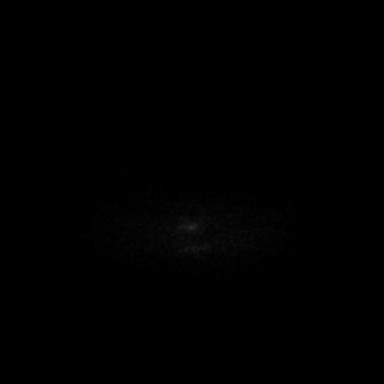

[Series 6: ax dwi_adc · axial · 3.0mm · 0.60mm/px · z∈[-59,+87]mm · 4 of 47 slices shown]
[im 1/47]
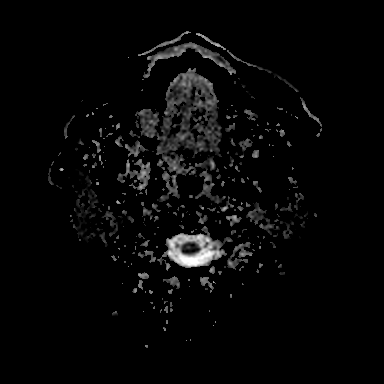
[im 16/47]
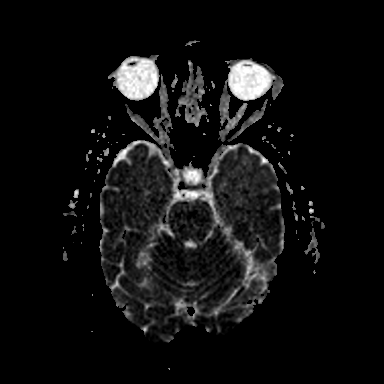
[im 31/47]
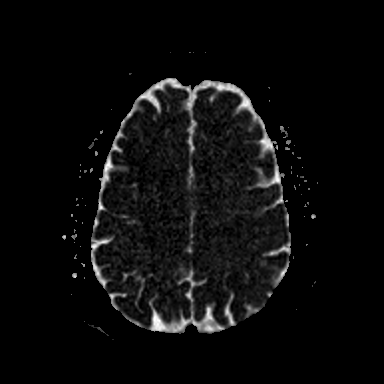
[im 47/47]
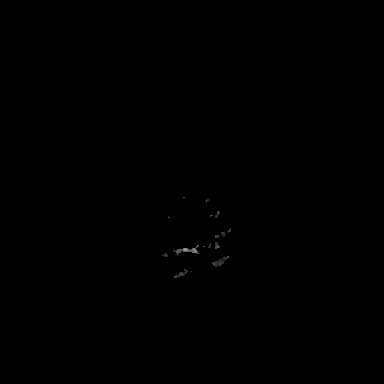

[Series 7: cor dwi_tracew · coronal · 5.0mm · 0.60mm/px · 3 of 40 slices shown]
[im 1/40]
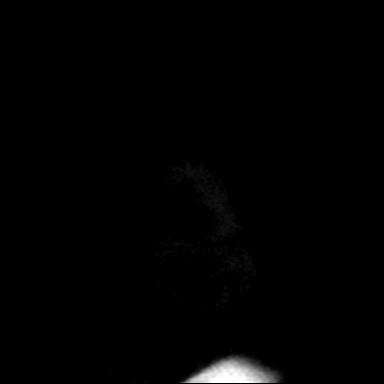
[im 20/40]
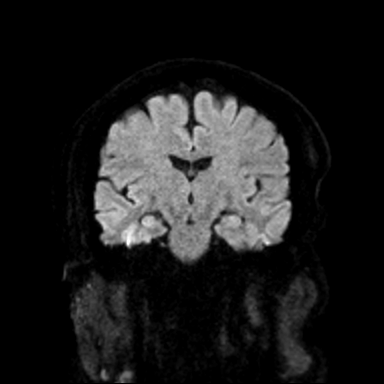
[im 40/40]
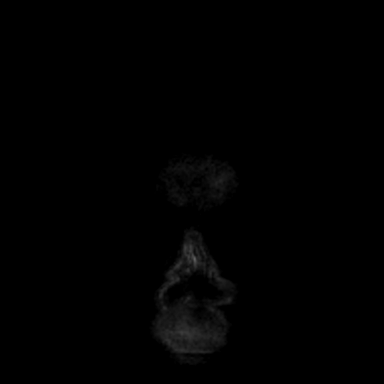

[Series 8: cor dwi_adc · coronal · 5.0mm · 0.60mm/px · 3 of 40 slices shown]
[im 1/40]
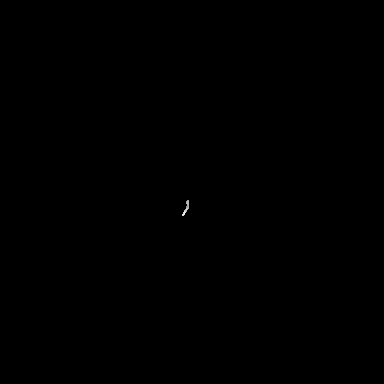
[im 20/40]
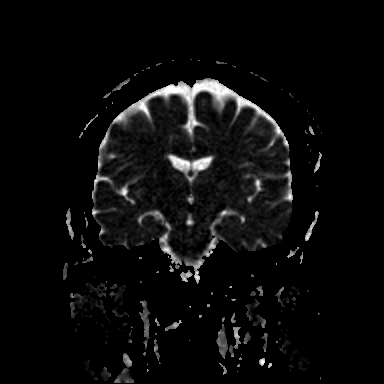
[im 40/40]
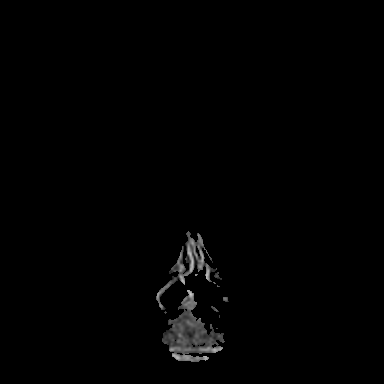

[Series 9: T1 · sagittal · 5.0mm · 0.62mm/px · 2 of 21 slices shown (1 of 2)]
[im 1/21]
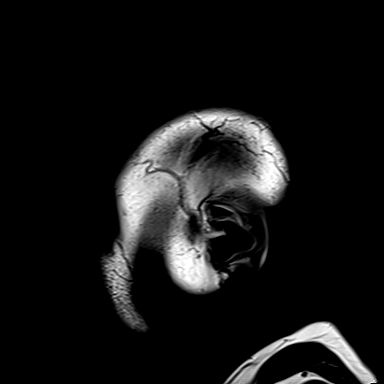
[im 21/21]
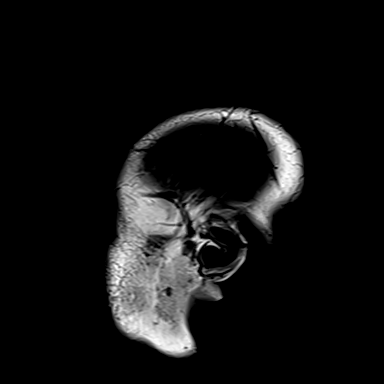

[Series 10: T2 · axial · 5.0mm · 0.53mm/px · z∈[-56,+83]mm · 2 of 25 slices shown (1 of 2)]
[im 1/25]
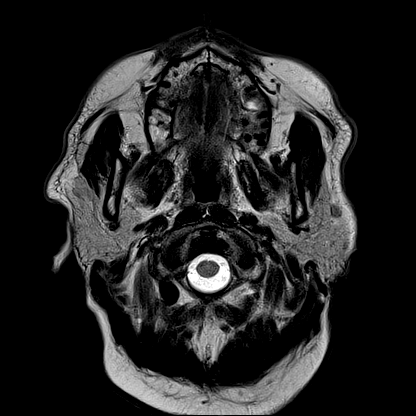
[im 25/25]
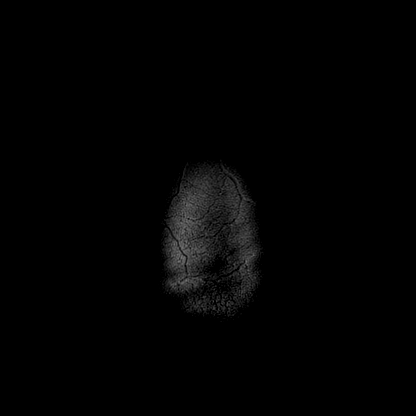

[Series 12: pha_images · axial · 3.0mm · 0.90mm/px · z∈[-72,+99]mm · 5 of 58 slices shown]
[im 1/58]
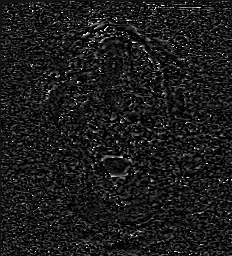
[im 15/58]
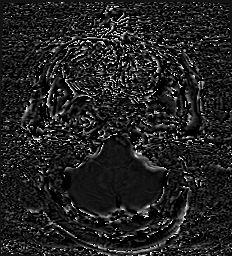
[im 29/58]
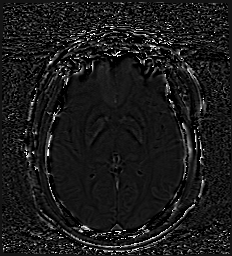
[im 43/58]
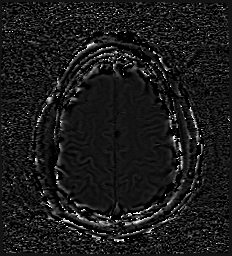
[im 58/58]
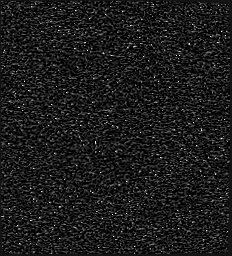

[Series 13: swi_images · axial · 3.0mm · 0.90mm/px · z∈[-72,+99]mm · 5 of 60 slices shown]
[im 1/60]
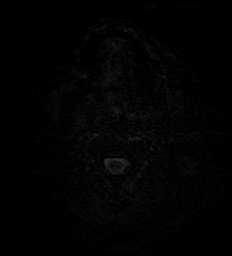
[im 15/60]
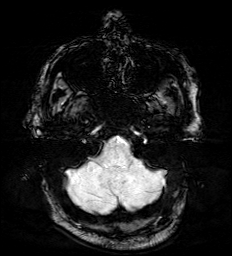
[im 30/60]
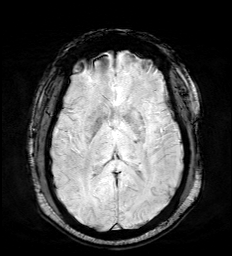
[im 45/60]
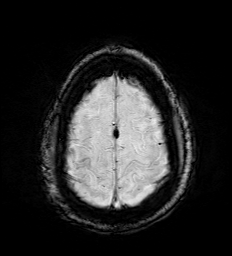
[im 60/60]
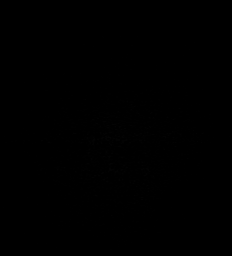

[Series 15: FLAIR · axial · 3.0mm · 0.53mm/px · z∈[-64,+92]mm · 4 of 55 slices shown]
[im 1/55]
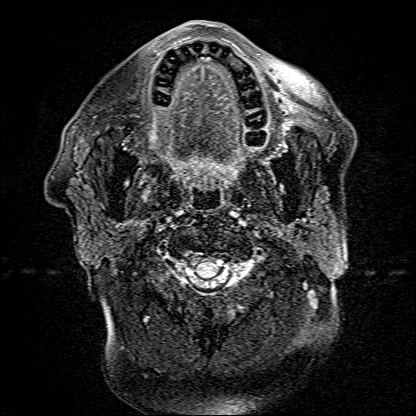
[im 19/55]
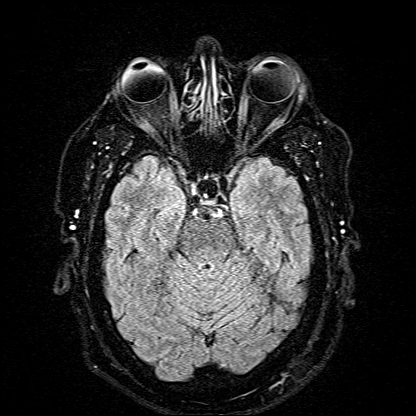
[im 37/55]
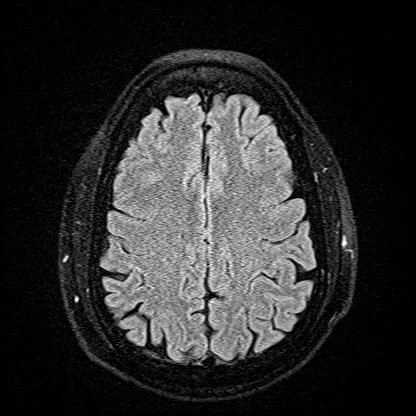
[im 55/55]
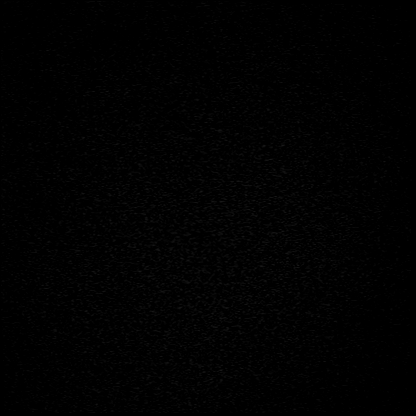

[Series 16: T1 · axial · 1.0mm · 0.98mm/px · z∈[-67,+102]mm · 10 of 176 slices shown (2 of 2)]
[im 1/176]
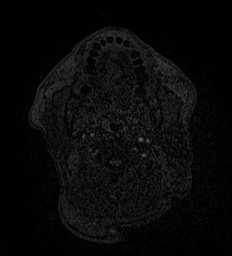
[im 14/176]
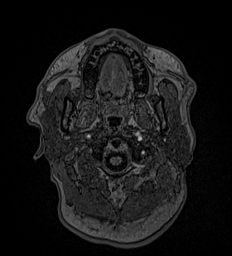
[im 27/176]
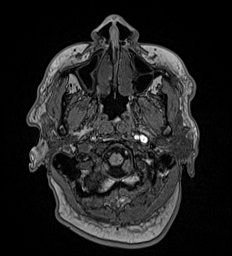
[im 41/176]
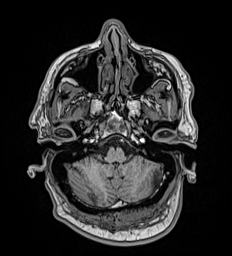
[im 54/176]
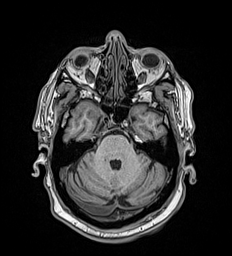
[im 81/176]
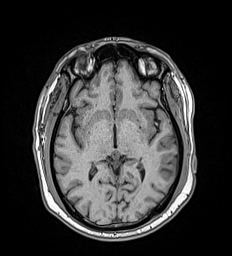
[im 95/176]
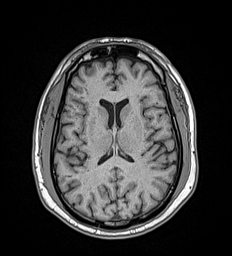
[im 122/176]
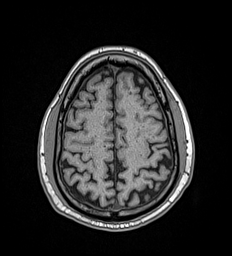
[im 149/176]
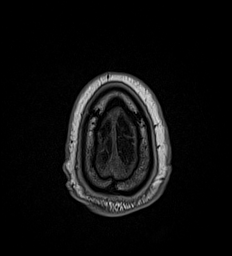
[im 176/176]
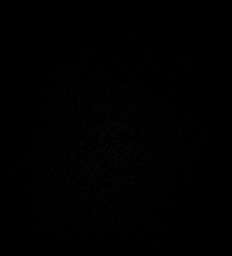

[Series 17: T2 · coronal · 5.0mm · 0.57mm/px · 2 of 29 slices shown (2 of 2)]
[im 1/29]
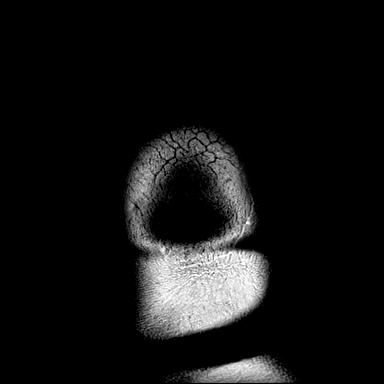
[im 29/29]
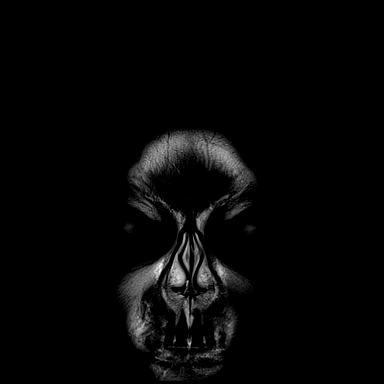

[44 of 48 positions shown; findings below may reference images not displayed]

FINDINGS: Brain: No acute infarct, acute hemorrhage or extra-axial collection.
A few scattered foci of GXLY-hyperintensity in the white matter.
Normal volume of CSF spaces. Single chronic microhemorrhage in the
left basal ganglia. Normal midline structures.

Vascular: Major flow voids are preserved.

Skull and upper cervical spine: Normal calvarium and skull base.
Visualized upper cervical spine and soft tissues are normal.

Sinuses/Orbits:No paranasal sinus fluid levels or advanced mucosal
thickening. No mastoid or middle ear effusion. Normal orbits.
IMPRESSION: Single chronic microhemorrhage in the left basal ganglia. Otherwise
normal brain MRI.

## 2022-01-30 ENCOUNTER — Emergency Department (HOSPITAL_COMMUNITY)
Admission: EM | Admit: 2022-01-30 | Discharge: 2022-01-30 | Disposition: A | Discharge status: Home / Self Care | Payer: 59 | Attending: Emergency Medicine | Admitting: Emergency Medicine

## 2022-01-30 ENCOUNTER — Emergency Department (HOSPITAL_COMMUNITY): Payer: 59

## 2022-01-30 ENCOUNTER — Other Ambulatory Visit: Payer: Self-pay

## 2022-01-30 DIAGNOSIS — Z7982 Long term (current) use of aspirin: Secondary | ICD-10-CM | POA: Diagnosis not present

## 2022-01-30 DIAGNOSIS — R112 Nausea with vomiting, unspecified: Secondary | ICD-10-CM

## 2022-01-30 DIAGNOSIS — Z7902 Long term (current) use of antithrombotics/antiplatelets: Secondary | ICD-10-CM | POA: Diagnosis not present

## 2022-01-30 DIAGNOSIS — I10 Essential (primary) hypertension: Secondary | ICD-10-CM | POA: Insufficient documentation

## 2022-01-30 DIAGNOSIS — E119 Type 2 diabetes mellitus without complications: Secondary | ICD-10-CM | POA: Diagnosis not present

## 2022-01-30 DIAGNOSIS — Z7984 Long term (current) use of oral hypoglycemic drugs: Secondary | ICD-10-CM | POA: Diagnosis not present

## 2022-01-30 DIAGNOSIS — J449 Chronic obstructive pulmonary disease, unspecified: Secondary | ICD-10-CM | POA: Insufficient documentation

## 2022-01-30 DIAGNOSIS — U071 COVID-19: Secondary | ICD-10-CM | POA: Diagnosis not present

## 2022-01-30 DIAGNOSIS — Z79899 Other long term (current) drug therapy: Secondary | ICD-10-CM | POA: Diagnosis not present

## 2022-01-30 LAB — COMPREHENSIVE METABOLIC PANEL
ALT: 35 U/L (ref 0–44)
AST: 30 U/L (ref 15–41)
Albumin: 3.7 g/dL (ref 3.5–5.0)
Alkaline Phosphatase: 96 U/L (ref 38–126)
Anion gap: 9 (ref 5–15)
BUN: 17 mg/dL (ref 8–23)
CO2: 24 mmol/L (ref 22–32)
Calcium: 8.2 mg/dL — ABNORMAL LOW (ref 8.9–10.3)
Chloride: 109 mmol/L (ref 98–111)
Creatinine, Ser: 1.01 mg/dL (ref 0.61–1.24)
GFR, Estimated: >60 mL/min (ref >60)
Glucose, Bld: 171 mg/dL — ABNORMAL HIGH (ref 70–99)
Potassium: 4.2 mmol/L (ref 3.5–5.1)
Sodium: 142 mmol/L (ref 135–145)
Total Bilirubin: 0.4 mg/dL (ref 0.3–1.2)
Total Protein: 7.3 g/dL (ref 6.5–8.1)

## 2022-01-30 LAB — CBC WITH DIFFERENTIAL/PLATELET
Abs Immature Granulocytes: 0.01 10*3/uL (ref 0.00–0.07)
Basophils Absolute: 0 10*3/uL (ref 0.0–0.1)
Basophils Relative: 0 %
Eosinophils Absolute: 0 10*3/uL (ref 0.0–0.5)
Eosinophils Relative: 0 %
HCT: 38.9 % — ABNORMAL LOW (ref 39.0–52.0)
Hemoglobin: 13.4 g/dL (ref 13.0–17.0)
Immature Granulocytes: 0 %
Lymphocytes Relative: 25 %
Lymphs Abs: 1.7 10*3/uL (ref 0.7–4.0)
MCH: 30.9 pg (ref 26.0–34.0)
MCHC: 34.4 g/dL (ref 30.0–36.0)
MCV: 89.6 fL (ref 80.0–100.0)
Monocytes Absolute: 0.4 10*3/uL (ref 0.1–1.0)
Monocytes Relative: 6 %
Neutro Abs: 4.6 10*3/uL (ref 1.7–7.7)
Neutrophils Relative %: 69 %
Platelets: 224 10*3/uL (ref 150–400)
RBC: 4.34 MIL/uL (ref 4.22–5.81)
RDW: 13.7 % (ref 11.5–15.5)
WBC: 6.8 10*3/uL (ref 4.0–10.5)
nRBC: 0 % (ref 0.0–0.2)

## 2022-01-30 MED ORDER — ONDANSETRON HCL 4 MG/2ML IJ SOLN
4.0000 mg | Freq: Once | INTRAMUSCULAR | Status: AC
  Administered 2022-01-30: 4 mg via INTRAVENOUS
  Filled 2022-01-30: qty 2

## 2022-01-30 MED ORDER — SUMATRIPTAN SUCCINATE 6 MG/0.5ML ~~LOC~~ SOLN: 6.0000 mg | Freq: Once | SUBCUTANEOUS | Status: DC

## 2022-01-30 MED ORDER — ONDANSETRON HCL 4 MG PO TABS: 4.0000 mg | ORAL_TABLET | Freq: Four times a day (QID) | ORAL | 0 refills | Status: AC

## 2022-01-30 MED ORDER — SODIUM CHLORIDE 0.9 % IV BOLUS
500.0000 mL | Freq: Once | INTRAVENOUS | Status: AC
  Administered 2022-01-30: 500 mL via INTRAVENOUS

## 2022-01-30 NOTE — ED Notes (Signed)
AVS with prescriptions provided to and discussed with patient. Pt verbalizes understanding of discharge instructions and denies any questions or concerns at this time. Pt ambulated out of department independently with steady gait. ? ?

## 2022-01-30 NOTE — ED Notes (Signed)
Pt reports prescribed paxlovid and z-pack yesterday at [facility], but has not filled them yet. Pt states he is not feeling better since visit yesterday.

## 2022-01-30 NOTE — Discharge Instructions (Addendum)
Seen today with nausea vomiting and diarrhea likely from your COVID 19.  You are being given Zofran for this.  Pick up the Paxlovid medication you are given at your visit yesterday.  While taking this you should only take your amlodipine every other day.    Follow-up closely with your primary care doctor and come back to the ER for any new or worsening symptoms.

## 2022-01-30 NOTE — ED Provider Notes (Signed)
[facility] Provider Note   CSN: [id number] Arrival date & time: 01/30/22  7829     History  Chief Complaint  Patient presents with   Shortness of Breath    Justin Vasquez is a 62 y.o. male.  He has a past medical history of diabetes, COPD and hypertension.  He does not wear home oxygen presents the ER complaining of nausea vomiting and diarrhea, unable to keep fluids down since yesterday.  He was diagnosed with COVID-19 2 days ago at urgent care, was seen yesterday at [facility] and states he was given Paxlovid and a Z-Pak.  He did not pick up the medications but has been continuously vomiting and having diarrhea which are his most bothersome symptoms.  He admits to some mild shortness of breath.  He has had a cough that is productive of clear sputum   Shortness of Breath Associated symptoms: cough        Home Medications Prior to Admission medications   Medication Sig Start Date End Date Taking? Authorizing Provider  ondansetron (ZOFRAN) 4 MG tablet Take 1 tablet (4 mg total) by mouth every 6 (six) hours. 01/30/22  Yes Aloysious Vangieson A, PA-C  albuterol (VENTOLIN HFA) 108 (90 Base) MCG/ACT inhaler     [provider]  amLODipine (NORVASC) 10 MG tablet Take 10 mg by mouth daily.    [provider]  aspirin 325 MG tablet Take 325 mg by mouth daily.    [provider]  calcium-vitamin D (OSCAL WITH D) 500-200 MG-UNIT tablet Take 2 tablets by mouth. 1000 units a day; take one a day    [provider]  carvedilol (COREG) 12.5 MG tablet Take 12.5 mg by mouth 2 (two) times daily with a meal.    [provider]  clopidogrel (PLAVIX) 75 MG tablet Take 75 mg by mouth daily.    [provider]  Cyanocobalamin (VITAMIN B 12) 250 MCG LOZG Take 250 mcg by mouth daily. 2 tablets daily.    [provider]  fluticasone (FLONASE) 50 MCG/ACT nasal spray Place into both nostrils daily.     [provider]  glimepiride (AMARYL) 4 MG tablet Take 4 mg by mouth daily with breakfast.    [provider]  hydrochlorothiazide (HYDRODIURIL) 12.5 MG tablet Take 12.5 mg by mouth daily.    [provider]  insulin glargine (LANTUS) 100 UNIT/ML injection Inject 38 Units into the skin daily.    [provider]  metFORMIN (GLUCOPHAGE) 500 MG tablet Take 500 mg by mouth 2 (two) times daily with a meal.    [provider]  Multiple Vitamins-Minerals (THEREMS M PO) Take by mouth.    [provider]  OLMESARTAN MEDOXOMIL PO Take 20 mg by mouth. Take 1 1/2 daily    [provider]  ondansetron (ZOFRAN) 4 MG tablet Take 4 mg by mouth every 8 (eight) hours as needed for nausea or vomiting.    [provider]  pantoprazole (PROTONIX) 40 MG tablet Take 40 mg by mouth daily.    [provider]  ranitidine (ZANTAC) 150 MG capsule Take 150 mg by mouth 2 (two) times daily.    [provider]  rosuvastatin (CRESTOR) 20 MG tablet Take 20 mg by mouth daily.    [provider]      Allergies    Lisinopril-hydrochlorothiazide    Review of Systems   Review of Systems  Respiratory:  Positive for  cough and shortness of breath.     Physical Exam Updated Vital Signs BP (!) 150/85   Pulse 66   Temp 97.9 F (36.6 C) (Oral)   Resp 18   Ht 5\' 3"  (1.6 m)   Wt 72.6 kg   SpO2 99%   BMI 28.34 kg/m  Physical Exam Vitals and nursing note reviewed.  Constitutional:      General: He is not in acute distress.    Appearance: He is well-developed.  HENT:     Head: Normocephalic and atraumatic.  Eyes:     Extraocular Movements: Extraocular movements intact.     Conjunctiva/sclera: Conjunctivae normal.  Cardiovascular:     Rate and Rhythm: Normal rate and regular rhythm.     Heart sounds: No murmur heard. Pulmonary:     Effort: Pulmonary effort is normal. No respiratory distress.     Breath sounds: Normal  breath sounds.  Abdominal:     Palpations: Abdomen is soft. There is no mass.     Tenderness: There is no abdominal tenderness. There is no guarding.  Musculoskeletal:        General: No swelling. Normal range of motion.     Cervical back: Neck supple.     Right lower leg: No edema.     Left lower leg: No edema.  Skin:    General: Skin is warm and dry.     Capillary Refill: Capillary refill takes less than 2 seconds.  Neurological:     General: No focal deficit present.     Mental Status: He is alert and oriented to person, place, and time.  Psychiatric:        Mood and Affect: Mood normal.     ED Results / Procedures / Treatments   Labs (all labs ordered are listed, but only abnormal results are displayed) Labs Reviewed  CBC WITH DIFFERENTIAL/PLATELET - Abnormal; Notable for the following components:      Result Value   HCT 38.9 (*)    All other components within normal limits  COMPREHENSIVE METABOLIC PANEL - Abnormal; Notable for the following components:   Glucose, Bld 171 (*)    Calcium 8.2 (*)    All other components within normal limits    EKG EKG Interpretation  Date/Time:  Sunday January 30 2022 09:39:05 EST Ventricular Rate:  62 PR Interval:  183 QRS Duration: 90 QT Interval:  429 QTC Calculation: 436 R Axis:   9 Text Interpretation: Sinus rhythm Confirmed by Noemi Chapel [phone]) on 01/30/2022 10:00:43 AM  Radiology DG Chest Portable 1 View  Result Date: 01/30/2022 CLINICAL DATA:  Shortness of breath. EXAM: PORTABLE CHEST 1 VIEW COMPARISON:  Report for chest x-ray 09/26/2019 has been reviewed. Images for that study are currently unavailable. FINDINGS: The lungs are clear without focal pneumonia, edema, pneumothorax or pleural effusion. The cardiopericardial silhouette is within normal limits for size. The visualized bony structures of the thorax are unremarkable. Telemetry leads overlie the chest. IMPRESSION: No active disease. Electronically Signed   By:  Misty Stanley M.D.   On: 01/30/2022 11:25    Procedures Procedures    Medications Ordered in ED Medications  ondansetron (ZOFRAN) injection 4 mg (4 mg Intravenous Given 01/30/22 0953)  sodium chloride 0.9 % bolus 500 mL (0 mLs Intravenous Stopped 01/30/22 1054)    ED Course/ Medical Decision Making/ A&P  Medical Decision Making This patient presents to the ED for concern of N/V with diarrhea, cough and SOB in the setting of COVID 19, this involves an extensive number of treatment options, and is a complaint that carries with it a high risk of complications and morbidity.  The differential diagnosis includes COVID-19, dehydration, pneumonia, gastroenteritis,other   Co morbidities that complicate the patient evaluation  DM, HTN   Additional history obtained:  Additional history obtained from EMR External records from outside source obtained and reviewed including urgent care visit on 01/28/22   Lab Tests:  I Ordered, and personally interpreted labs.  The pertinent results include:  reassuring CBC and CMP    Imaging Studies ordered:  I ordered imaging studies including Chest Xray  I independently visualized and interpreted imaging which showed No pulmonary edema or infiltrates I agree with the radiologist interpretation   Cardiac Monitoring: / EKG:  The patient was maintained on a cardiac monitor.  I personally viewed and interpreted the cardiac monitored which showed an underlying rhythm of: sinus rhythm, EKG sinus rhythm, no ischemic changes    Problem List / ED Course / Critical interventions / Medication management  Covid-19, nausea/vomiting/diarrhea I ordered medication including zofran  for nausea  Reevaluation of the patient after these medicines showed that the patient improved I have reviewed the patients home medicines and have made adjustments as needed    Test / Admission - Considered:  Considered admission but patient is  feeling better and tolerating PO at this time    Amount and/or Complexity of Data Reviewed Labs: ordered. Radiology: ordered.  Risk Prescription drug management.           Final Clinical Impression(s) / ED Diagnoses Final diagnoses:  COVID-19  Nausea vomiting and diarrhea    Rx / DC Orders ED Discharge Orders          Ordered    ondansetron (ZOFRAN) 4 MG tablet  Every 6 hours        01/30/22 [address] 01/30/22 1147    Eber Hong, MD 02/10/22 1453

## 2022-01-30 NOTE — ED Notes (Signed)
Pt provided with cup of water for fluid challenge.

## 2022-01-30 NOTE — ED Triage Notes (Signed)
Pt BIB [facility] from home C/O SOB and n/v/d X 1 week. Confirmed COVID + at outside hospital yesterday.

## 2022-01-30 NOTE — ED Notes (Signed)
Per lab, CMP hemolyzed, will come recollect.

## 2023-06-01 ENCOUNTER — Encounter (HOSPITAL_COMMUNITY): Payer: Self-pay

## 2023-06-01 ENCOUNTER — Other Ambulatory Visit: Payer: Self-pay

## 2023-06-01 ENCOUNTER — Emergency Department (HOSPITAL_COMMUNITY)

## 2023-06-01 ENCOUNTER — Emergency Department (HOSPITAL_COMMUNITY)
Admission: EM | Admit: 2023-06-01 | Discharge: 2023-06-01 | Disposition: A | Discharge status: Home / Self Care | Attending: Emergency Medicine | Admitting: Emergency Medicine

## 2023-06-01 DIAGNOSIS — Z7984 Long term (current) use of oral hypoglycemic drugs: Secondary | ICD-10-CM | POA: Diagnosis not present

## 2023-06-01 DIAGNOSIS — Z79899 Other long term (current) drug therapy: Secondary | ICD-10-CM | POA: Insufficient documentation

## 2023-06-01 DIAGNOSIS — S79911A Unspecified injury of right hip, initial encounter: Secondary | ICD-10-CM

## 2023-06-01 DIAGNOSIS — E1165 Type 2 diabetes mellitus with hyperglycemia: Secondary | ICD-10-CM | POA: Insufficient documentation

## 2023-06-01 DIAGNOSIS — W19XXXA Unspecified fall, initial encounter: Secondary | ICD-10-CM | POA: Diagnosis not present

## 2023-06-01 DIAGNOSIS — Z794 Long term (current) use of insulin: Secondary | ICD-10-CM | POA: Insufficient documentation

## 2023-06-01 DIAGNOSIS — Z7902 Long term (current) use of antithrombotics/antiplatelets: Secondary | ICD-10-CM | POA: Insufficient documentation

## 2023-06-01 DIAGNOSIS — Z7982 Long term (current) use of aspirin: Secondary | ICD-10-CM | POA: Insufficient documentation

## 2023-06-01 DIAGNOSIS — R739 Hyperglycemia, unspecified: Secondary | ICD-10-CM

## 2023-06-01 LAB — BASIC METABOLIC PANEL WITH GFR
Anion gap: 15 (ref 5–15)
BUN: 13 mg/dL (ref 8–23)
CO2: 18 mmol/L — ABNORMAL LOW (ref 22–32)
Calcium: 9.3 mg/dL (ref 8.9–10.3)
Chloride: 104 mmol/L (ref 98–111)
Creatinine, Ser: 1.27 mg/dL — ABNORMAL HIGH (ref 0.61–1.24)
GFR, Estimated: >60 mL/min (ref >60)
Glucose, Bld: 371 mg/dL — ABNORMAL HIGH (ref 70–99)
Potassium: 3.6 mmol/L (ref 3.5–5.1)
Sodium: 137 mmol/L (ref 135–145)

## 2023-06-01 LAB — I-STAT CHEM 8, ED
BUN: 12 mg/dL (ref 8–23)
Calcium, Ion: 1.15 mmol/L (ref 1.15–1.40)
Chloride: 106 mmol/L (ref 98–111)
Creatinine, Ser: 1.2 mg/dL (ref 0.61–1.24)
Glucose, Bld: 384 mg/dL — ABNORMAL HIGH (ref 70–99)
HCT: 41 % (ref 39.0–52.0)
Hemoglobin: 13.9 g/dL (ref 13.0–17.0)
Potassium: 3.7 mmol/L (ref 3.5–5.1)
Sodium: 140 mmol/L (ref 135–145)
TCO2: 20 mmol/L — ABNORMAL LOW (ref 22–32)

## 2023-06-01 LAB — URINALYSIS, ROUTINE W REFLEX MICROSCOPIC
Bacteria, UA: NONE SEEN
Bilirubin Urine: NEGATIVE
Glucose, UA: >=500 mg/dL — AB
Hgb urine dipstick: NEGATIVE
Ketones, ur: NEGATIVE mg/dL
Leukocytes,Ua: NEGATIVE
Nitrite: NEGATIVE
Protein, ur: NEGATIVE mg/dL
Specific Gravity, Urine: 1.027 (ref 1.005–1.030)
pH: 5 (ref 5.0–8.0)

## 2023-06-01 LAB — CBC
HCT: 40.8 % (ref 39.0–52.0)
Hemoglobin: 13.9 g/dL (ref 13.0–17.0)
MCH: 30.8 pg (ref 26.0–34.0)
MCHC: 34.1 g/dL (ref 30.0–36.0)
MCV: 90.5 fL (ref 80.0–100.0)
Platelets: 258 10*3/uL (ref 150–400)
RBC: 4.51 MIL/uL (ref 4.22–5.81)
RDW: 12.8 % (ref 11.5–15.5)
WBC: 6.4 10*3/uL (ref 4.0–10.5)
nRBC: 0 % (ref 0.0–0.2)

## 2023-06-01 LAB — BLOOD GAS, VENOUS
Acid-Base Excess: 0.7 mmol/L (ref 0.0–2.0)
Bicarbonate: 24.5 mmol/L (ref 20.0–28.0)
Drawn by: 66297
O2 Saturation: 96.3 %
Patient temperature: 37
pCO2, Ven: 36 mmHg — ABNORMAL LOW (ref 44–60)
pH, Ven: 7.44 — ABNORMAL HIGH (ref 7.25–7.43)
pO2, Ven: 74 mmHg — ABNORMAL HIGH (ref 32–45)

## 2023-06-01 LAB — CBG MONITORING, ED
Glucose-Capillary: 388 mg/dL — ABNORMAL HIGH (ref 70–99)
Glucose-Capillary: 349 mg/dL — ABNORMAL HIGH (ref 70–99)

## 2023-06-01 LAB — BETA-HYDROXYBUTYRIC ACID: Beta-Hydroxybutyric Acid: 0.19 mmol/L (ref 0.05–0.27)

## 2023-06-01 MED ORDER — INSULIN GLARGINE-YFGN 100 UNIT/ML ~~LOC~~ SOLN
40.0000 [IU] | SUBCUTANEOUS | Status: AC
  Administered 2023-06-01: 40 [IU] via SUBCUTANEOUS
  Filled 2023-06-01: qty 0.4

## 2023-06-01 MED ORDER — INSULIN PEN NEEDLE 32G X 8 MM MISC: 30.0000 | Freq: Every day | 3 refills | Status: AC

## 2023-06-01 MED ORDER — SODIUM CHLORIDE 0.9 % IV BOLUS
1000.0000 mL | Freq: Once | INTRAVENOUS | Status: AC
  Administered 2023-06-01: 1000 mL via INTRAVENOUS

## 2023-06-01 MED ORDER — INSULIN GLARGINE 100 UNIT/ML ~~LOC~~ SOLN
40.0000 [IU] | SUBCUTANEOUS | Status: DC
  Filled 2023-06-01: qty 0.4

## 2023-06-01 NOTE — Discharge Instructions (Signed)
 You were seen for your elevated blood sugar in the emergency department.   At home, please take the medications for your diabetes.    Check your MyChart online for the results of any tests that had not resulted by the time you left the emergency department.   Follow-up with your primary doctor in 2-3 days regarding your visit.    Return immediately to the emergency department if you experience any of the following: Shortness of breath, increased thirst, or any other concerning symptoms.    Thank you for visiting our Emergency Department. It was a pleasure taking care of you today.

## 2023-06-01 NOTE — ED Notes (Signed)
 TOC consulted for insulin needles. CSW updated MD that TOC unable to provide this. Apparently pt has issue with medicaid and getting needles. CSW called pts pharmacy [facility] who states they just got a new script for needles and pt has a zero dollar copay. CSW updated MD and RN that this has been done and they are filling meds at this time. Pt needs to follow up with PCP for scripts. TOC signing off.

## 2023-06-01 NOTE — ED Provider Notes (Signed)
 [facility] EMERGENCY DEPARTMENT AT [facility] Provider Note   CSN: [id number] Arrival date & time: 06/01/23  1436     History  Chief Complaint  Patient presents with   Hyperglycemia    Justin Vasquez is a 63 y.o. male.  63 year old male with a history of DM2 who presents emergency department with elevated blood sugar.  Patient reports that over the past month he has not been taking his insulin.  Ran out of needles and is having difficulty getting Medicaid to cover them.  Says that he typically takes 500 mg of metformin twice daily along with 54 units of Lantus nightly.  Says that his blood sugar today was in the high 400s so decided to come into the hospital.  Per EMS initial blood sugar was 499 and after 500 cc fluids went down to 363.  Patient denies any recent illnesses.  Says he is no longer taking the Jardiance that he was on before.       Home Medications Prior to Admission medications   Medication Sig Start Date End Date Taking? Authorizing Provider  Insulin Pen Needle 32G X 8 MM MISC 30 each by Does not apply route at bedtime. 06/01/23  Yes Ninetta Basket, MD  albuterol  (VENTOLIN  HFA) 108 [id number] Base) MCG/ACT inhaler     [provider]  amLODipine (NORVASC) 10 MG tablet Take 10 mg by mouth daily.    [provider]  aspirin 325 MG tablet Take 325 mg by mouth daily.    [provider]  calcium-vitamin D (OSCAL WITH D) 500-200 MG-UNIT tablet Take 2 tablets by mouth. 1000 units a day; take one a day    [provider]  carvedilol (COREG) 12.5 MG tablet Take 12.5 mg by mouth 2 (two) times daily with a meal.    [provider]  clopidogrel (PLAVIX) 75 MG tablet Take 75 mg by mouth daily.    [provider]  Cyanocobalamin (VITAMIN B 12) 250 MCG LOZG Take 250 mcg by mouth daily. 2 tablets daily.    [provider]  fluticasone (FLONASE) 50 MCG/ACT nasal spray Place into both nostrils daily.    [provider]  glimepiride (AMARYL) 4 MG tablet Take 4 mg by mouth daily with breakfast.    [provider]  hydrochlorothiazide (HYDRODIURIL) 12.5 MG tablet Take 12.5 mg by mouth daily.    [provider]  insulin glargine (LANTUS) 100 UNIT/ML injection Inject 38 Units into the skin daily.    [provider]  metFORMIN (GLUCOPHAGE) 500 MG tablet Take 500 mg by mouth 2 (two) times daily with a meal.    [provider]  Multiple Vitamins-Minerals (THEREMS M PO) Take by mouth.    [provider]  OLMESARTAN MEDOXOMIL PO Take 20 mg by mouth. Take 1 1/2 daily    [provider]  ondansetron  (ZOFRAN ) 4 MG tablet Take 4 mg by mouth every 8 (eight) hours as needed for nausea or vomiting.    [provider]  ondansetron  (ZOFRAN ) 4 MG tablet Take 1 tablet (4 mg total) by mouth every 6 (six) hours. 01/30/22   Baxter Limber A, PA-C  pantoprazole (PROTONIX) 40 MG tablet Take 40 mg by mouth daily.    [provider]  ranitidine (ZANTAC) 150 MG capsule Take 150 mg by mouth 2 (two) times daily.    [provider]  rosuvastatin (CRESTOR) 20 MG tablet Take 20 mg by mouth daily.  [provider]      Allergies    Lisinopril-hydrochlorothiazide    Review of Systems   Review of Systems  Physical Exam Updated Vital Signs BP 132/78   Pulse 67   Temp 98.3 F (36.8 C) (Oral)   Resp 16   Ht 5\' 3"  (1.6 m)   Wt 72.6 kg   SpO2 96%   BMI 28.34 kg/m  Physical Exam Vitals and nursing note reviewed.  Constitutional:      General: He is not in acute distress.    Appearance: He is well-developed.  HENT:     Head: Normocephalic and atraumatic.     Right Ear: External ear normal.     Left Ear: External ear normal.     Nose: Nose normal.  Eyes:     Extraocular Movements: Extraocular movements intact.     Conjunctiva/sclera: Conjunctivae normal.     Pupils: Pupils are equal, round, and reactive to light.   Cardiovascular:     Rate and Rhythm: Normal rate and regular rhythm.     Heart sounds: Normal heart sounds.  Pulmonary:     Effort: Pulmonary effort is normal. No respiratory distress.     Breath sounds: Normal breath sounds.  Abdominal:     General: There is no distension.     Palpations: There is no mass.     Tenderness: There is no abdominal tenderness. There is no guarding.  Musculoskeletal:     Cervical back: Normal range of motion and neck supple.     Right lower leg: No edema.     Left lower leg: No edema.  Skin:    General: Skin is warm and dry.  Neurological:     Mental Status: He is alert. Mental status is at baseline.  Psychiatric:        Mood and Affect: Mood normal.        Behavior: Behavior normal.     ED Results / Procedures / Treatments   Labs (all labs ordered are listed, but only abnormal results are displayed) Labs Reviewed  URINALYSIS, ROUTINE W REFLEX MICROSCOPIC - Abnormal; Notable for the following components:      Result Value   Color, Urine STRAW (*)    Glucose, UA >=500 (*)    All other components within normal limits  BASIC METABOLIC PANEL WITH GFR - Abnormal; Notable for the following components:   CO2 18 (*)    Glucose, Bld 371 (*)    Creatinine, Ser 1.27 (*)    All other components within normal limits  BLOOD GAS, VENOUS - Abnormal; Notable for the following components:   pH, Ven 7.44 (*)    pCO2, Ven 36 (*)    pO2, Ven 74 (*)    All other components within normal limits  CBG MONITORING, ED - Abnormal; Notable for the following components:   Glucose-Capillary 388 (*)    All other components within normal limits  I-STAT CHEM 8, ED - Abnormal; Notable for the following components:   Glucose, Bld 384 (*)    TCO2 20 (*)    All other components within normal limits  CBG MONITORING, ED - Abnormal; Notable for the following components:   Glucose-Capillary 349 (*)    All other components within normal limits  CBC  BETA-HYDROXYBUTYRIC ACID     EKG EKG Interpretation Date/Time:  Thursday Jun 01 2023 14:56:12 EDT Ventricular Rate:  84 PR Interval:  178 QRS Duration:  91 QT Interval:  349 QTC Calculation: 413  R Axis:   45  Text Interpretation: Sinus rhythm Borderline repolarization abnormality Confirmed by Shyrl Doyne [phone]) on 06/01/2023 3:22:09 PM  Radiology No results found.  Procedures Procedures    Medications Ordered in ED Medications  sodium chloride  0.9 % bolus 1,000 mL (0 mLs Intravenous Stopped 06/01/23 1550)  insulin glargine-yfgn (SEMGLEE) injection 40 Units (40 Units Subcutaneous Given 06/01/23 1622)    ED Course/ Medical Decision Making/ A&P                                 Medical Decision Making Amount and/or Complexity of Data Reviewed Labs: ordered. Radiology: ordered.  Risk OTC drugs. Prescription drug management.   Justin Vasquez is a 63 y.o. male with comorbidities that complicate the patient evaluation including diabetes on Lantus and metformin who presents emergency department with elevated blood sugar  Initial Ddx:  DKA, HHS, hyperglycemia, infection, medication noncompliance, hyperkalemia/electrolyte abnormality  MDM:  Suspect that the patient likely has hyperglycemia in the setting of not taking his insulin.  With the patient's elevated blood sugar we will obtain blood work to rule out DKA.  Appears to be mentating well currently so low concern for HHS. No clear infectious causes that would have precipitated the patient's symptoms.  They report being compliant with her medication as well.  Will obtain lab work and EKG in case the patient is hyperkalemic or has any other electrolyte abnormalities.  Plan:  Labs VBG Urinalysis EKG  ED Summary/Re-evaluation:  Lab work not consistent with diabetic ketoacidosis.  Hyperglycemia has improved markedly with IV fluids.  Given Lantus in the emergency department.  Has not picked up his insulin needles because he reports that cost  too much.  Social work was consulted and we did send the needles to his pharmacy and they report that there is $0 co-pay for them at this time.  When his friend came to pick him up she stated that he had a fall recently and is having some right hip pain since then.  No head strike or LOC.  Was mechanical in the bathtub.  Has full range of motion of the right hip.  Has been able to bear weight.  X-rays obtained and do not show evidence of fracture.  This patient presents to the ED for concern of complaints listed in HPI, this involves an extensive number of treatment options, and is a complaint that carries with it a high risk of complications and morbidity. Disposition including potential need for admission considered.   Dispo: DC Home. Return precautions discussed including, but not limited to, those listed in the AVS. Allowed pt time to ask questions which were answered fully prior to dc.  Additional history obtained from EMS Records reviewed Outpatient Clinic Notes The following labs were independently interpreted: Chemistry and show Hyperglycemia I independently reviewed the following imaging with scope of interpretation limited to determining acute life threatening conditions related to emergency care: Extremity x-ray(s) and agree with the radiologist interpretation with the following exceptions: none I personally reviewed and interpreted cardiac monitoring: normal sinus rhythm  I personally reviewed and interpreted the pt's EKG: see above for interpretation  I have reviewed the patients home medications and made adjustments as needed   Final Clinical Impression(s) / ED Diagnoses Final diagnoses:  Hyperglycemia  Hip injury, right, initial encounter    Rx / DC Orders ED Discharge Orders          Ordered  Insulin Pen Needle 32G X 8 MM MISC  Daily at bedtime        06/01/23 1556              Ninetta Basket, MD 06/01/23 [phone]

## 2023-06-01 NOTE — ED Triage Notes (Signed)
 Pt has not had insulin needles in 1 month and wasn't feeling well.  EMS reports they are getting him insulin needles via a program through [employer].  EMS says initial CBG was 499 and sugar went down to 363 after of NS.

## 2023-07-11 NOTE — Progress Notes (Signed)
 POST OP CATARACT 1 MONTH     ASSESSMENT:    Justin Vasquez is status post cataract surgery approximately 4 weeks ago in the left eye. Doing well with a normal postoperative appearance. Trace corneal edema but appears to be clearing slowly.     PLAN: Prednisolone Acetate 1% taper TID for 1 week then BID for 1 week then QD for 1 week then D/C  Stop Acular Left Eye.   One day after phaco and PC IOL right eye. Corneal edema, should clear with time. Shield at bedtime, glasses during the day. Complete written instructions given and reviewed verbally. Use Ocuflox, Acular, and Pred Acetate 1% four times a day right eye.  The patient has elected to return to the care of their primary eye care provide, Dr. Odetta, for postoperative care due to the distance of their home to my [facility] and the frequency of my office hours in [place]. I obtained written consent from the patient for this and gave the patient a letter with instructions to call their primary eye care provider for an appointment in the next week. I also told them that I was available in the [facility] office for follow-up in 1 week if they are unable to make an appointment and I gave them the emergency number to call if they have problems in between visits.

## 2023-07-20 ENCOUNTER — Encounter (HOSPITAL_COMMUNITY): Payer: Self-pay

## 2023-07-20 ENCOUNTER — Other Ambulatory Visit: Payer: Self-pay

## 2023-07-20 ENCOUNTER — Emergency Department (HOSPITAL_COMMUNITY)

## 2023-07-20 ENCOUNTER — Emergency Department (HOSPITAL_COMMUNITY)
Admission: EM | Admit: 2023-07-20 | Discharge: 2023-07-20 | Disposition: A | Discharge status: Home / Self Care | Source: Other Acute Inpatient Hospital | Attending: Emergency Medicine | Admitting: Emergency Medicine

## 2023-07-20 DIAGNOSIS — E119 Type 2 diabetes mellitus without complications: Secondary | ICD-10-CM | POA: Insufficient documentation

## 2023-07-20 DIAGNOSIS — Z8673 Personal history of transient ischemic attack (TIA), and cerebral infarction without residual deficits: Secondary | ICD-10-CM | POA: Diagnosis not present

## 2023-07-20 DIAGNOSIS — Z7951 Long term (current) use of inhaled steroids: Secondary | ICD-10-CM | POA: Diagnosis not present

## 2023-07-20 DIAGNOSIS — Z7902 Long term (current) use of antithrombotics/antiplatelets: Secondary | ICD-10-CM | POA: Insufficient documentation

## 2023-07-20 DIAGNOSIS — Z794 Long term (current) use of insulin: Secondary | ICD-10-CM | POA: Diagnosis not present

## 2023-07-20 DIAGNOSIS — Z7984 Long term (current) use of oral hypoglycemic drugs: Secondary | ICD-10-CM | POA: Diagnosis not present

## 2023-07-20 DIAGNOSIS — Z7982 Long term (current) use of aspirin: Secondary | ICD-10-CM | POA: Diagnosis not present

## 2023-07-20 DIAGNOSIS — R079 Chest pain, unspecified: Secondary | ICD-10-CM | POA: Diagnosis present

## 2023-07-20 DIAGNOSIS — J449 Chronic obstructive pulmonary disease, unspecified: Secondary | ICD-10-CM | POA: Diagnosis not present

## 2023-07-20 DIAGNOSIS — I10 Essential (primary) hypertension: Secondary | ICD-10-CM | POA: Diagnosis not present

## 2023-07-20 DIAGNOSIS — Z79899 Other long term (current) drug therapy: Secondary | ICD-10-CM | POA: Diagnosis not present

## 2023-07-20 LAB — BASIC METABOLIC PANEL WITH GFR
Anion gap: 13 (ref 5–15)
BUN: 19 mg/dL (ref 8–23)
CO2: 22 mmol/L (ref 22–32)
Calcium: 9.8 mg/dL (ref 8.9–10.3)
Chloride: 103 mmol/L (ref 98–111)
Creatinine, Ser: 1.09 mg/dL (ref 0.61–1.24)
GFR, Estimated: >60 mL/min (ref >60)
Glucose, Bld: 143 mg/dL — ABNORMAL HIGH (ref 70–99)
Potassium: 3.8 mmol/L (ref 3.5–5.1)
Sodium: 138 mmol/L (ref 135–145)

## 2023-07-20 LAB — CBC
HCT: 41.2 % (ref 39.0–52.0)
Hemoglobin: 14 g/dL (ref 13.0–17.0)
MCH: 30.8 pg (ref 26.0–34.0)
MCHC: 34 g/dL (ref 30.0–36.0)
MCV: 90.5 fL (ref 80.0–100.0)
Platelets: 270 K/uL (ref 150–400)
RBC: 4.55 MIL/uL (ref 4.22–5.81)
RDW: 13 % (ref 11.5–15.5)
WBC: 6.9 K/uL (ref 4.0–10.5)
nRBC: 0 % (ref 0.0–0.2)

## 2023-07-20 LAB — D-DIMER, QUANTITATIVE: D-Dimer, Quant: 0.43 ug{FEU}/mL (ref 0.00–0.50)

## 2023-07-20 LAB — TROPONIN I (HIGH SENSITIVITY)
Troponin I (High Sensitivity): 3 ng/L (ref <18)
Troponin I (High Sensitivity): 3 ng/L (ref <18)

## 2023-07-20 NOTE — ED Triage Notes (Signed)
 Pt bib ems from [facility] for intermittent CP for a week, Hx of COPD, Bell Palsy, TIAs. EMS gave 324 of asprin, 179 CBG, BP 140s/90s, Pt AAOx4. Pt also endroses SOB lasting a couple weeks.

## 2023-07-20 NOTE — ED Notes (Signed)
 Called [facility] at [phone], no answer. Unable to leave VM.

## 2023-07-20 NOTE — ED Notes (Signed)
 Called [facility], no answer, VM full. Unsure if this is the group home family resides in. Patient unable to confirm.

## 2023-07-20 NOTE — ED Notes (Signed)
 Attempted to call [facility] again, no answer. No VM able to be left.

## 2023-07-20 NOTE — ED Notes (Signed)
 Taken via W/C to lobby to wait for transport back to facility.

## 2023-07-20 NOTE — Discharge Instructions (Addendum)
 Thankfully your testing does not show any signs of blood clot heart attack or lung problems.  You can take an anti-inflammatory for your pain such as aspirin or Mobic.  Take Mobic once a day.  Call the cardiologist for follow-up this week, ER for worsening symptoms

## 2023-07-20 NOTE — ED Notes (Signed)
 Erica with [facility] called back at this time. Per Geni Lenis Humphrey [phone]) needs to be called so that he can arrange transport back to group home. Called [facility], and he will call back shortly with an ETA for one of his drivers.

## 2023-07-20 NOTE — ED Notes (Signed)
 Pt dressed into personal clothing, waiting on transport back to facility

## 2023-07-20 NOTE — ED Provider Notes (Addendum)
 [place] EMERGENCY DEPARTMENT AT [facility] Provider Note   CSN: [id number] Arrival date & time: 07/20/23  1224     Patient presents with: Chest Pain   Justin Vasquez is a 63 y.o. male.    Chest Pain  This patient is a 63 year old male coming from a family group home, he is on Plavix, states he has had prior TIA, he is also a diabetic on medications, hypertensive on medications and high cholesterol.  He does not smoke cigarettes.  He presents stating that he has had chest pain going on for the last week, seems to be in the upper chest, radiation to the back, worse with taking a deep breath and not associated with nausea fevers chills or coughing.  He has no swelling of the legs although he states that his left calf feels a little bit uncomfortable.  There has been no nausea vomiting or diarrhea, no diaphoresis.  He does not have any exertional chest pain.  The paramedics gave the patient aspirin and noted his blood sugar to be normal, vital signs to be unremarkable    Prior to Admission medications   Medication Sig Start Date End Date Taking? Authorizing Provider  albuterol  (VENTOLIN  HFA) 108 (90 Base) MCG/ACT inhaler     [provider]  amLODipine (NORVASC) 10 MG tablet Take 10 mg by mouth daily.    [provider]  aspirin 325 MG tablet Take 325 mg by mouth daily.    [provider]  calcium-vitamin D (OSCAL WITH D) 500-200 MG-UNIT tablet Take 2 tablets by mouth. 1000 units a day; take one a day    [provider]  carvedilol (COREG) 12.5 MG tablet Take 12.5 mg by mouth 2 (two) times daily with a meal.    [provider]  clopidogrel (PLAVIX) 75 MG tablet Take 75 mg by mouth daily.    [provider]  Cyanocobalamin (VITAMIN B 12) 250 MCG LOZG Take 250 mcg by mouth daily. 2 tablets daily.    [provider]  fluticasone (FLONASE) 50 MCG/ACT nasal spray Place into both nostrils daily.    [provider]  glimepiride (AMARYL) 4 MG tablet Take 4 mg by mouth daily with breakfast.    [provider]  hydrochlorothiazide (HYDRODIURIL) 12.5 MG tablet Take 12.5 mg by mouth daily.    [provider]  insulin  glargine (LANTUS ) 100 UNIT/ML injection Inject 38 Units into the skin daily.    [provider]  Insulin  Pen Needle 32G X 8 MM MISC 30 each by Does not apply route at bedtime. 06/01/23   Yolande Lamar BROCKS, MD  metFORMIN (GLUCOPHAGE) 500 MG tablet Take 500 mg by mouth 2 (two) times daily with a meal.    [provider]  Multiple Vitamins-Minerals (THEREMS M PO) Take by mouth.    [provider]  OLMESARTAN MEDOXOMIL PO Take 20 mg by mouth. Take 1 1/2 daily    [provider]  ondansetron  (ZOFRAN ) 4 MG tablet Take 4 mg by mouth every 8 (eight) hours as needed for nausea or vomiting.    [provider]  ondansetron  (ZOFRAN ) 4 MG tablet Take 1 tablet (4 mg total) by mouth every 6 (six) hours. 01/30/22   Suellen Cantor A, PA-C  pantoprazole (PROTONIX) 40 MG tablet Take 40 mg by mouth daily.    [provider]  ranitidine (ZANTAC) 150 MG capsule Take 150 mg by mouth 2 (two) times daily.    [provider]  rosuvastatin (CRESTOR) 20 MG tablet Take 20 mg by mouth daily.    [provider]    Allergies: Lisinopril-hydrochlorothiazide    Review of Systems  Cardiovascular:  Positive for chest pain.  All other systems reviewed and are negative.   Updated Vital Signs BP 131/75   Pulse 76   Temp 97.9 F (36.6 C)   Resp 14   Ht 1.6 m (5' 3)   Wt 72.6 kg   SpO2 95%   BMI 28.35 kg/m   Physical Exam Vitals and nursing note reviewed.  Constitutional:      General: He is not in acute distress.    Appearance: He is well-developed.  HENT:     Head: Normocephalic and atraumatic.     Mouth/Throat:     Pharynx: No oropharyngeal exudate.  Eyes:     General: No scleral icterus.       Right eye: No  discharge.        Left eye: No discharge.     Conjunctiva/sclera: Conjunctivae normal.     Pupils: Pupils are equal, round, and reactive to light.  Neck:     Thyroid: No thyromegaly.     Vascular: No JVD.  Cardiovascular:     Rate and Rhythm: Normal rate and regular rhythm.     Heart sounds: Normal heart sounds. No murmur heard.    No friction rub. No gallop.  Pulmonary:     Effort: Pulmonary effort is normal. No respiratory distress.     Breath sounds: Normal breath sounds. No wheezing or rales.  Abdominal:     General: Bowel sounds are normal. There is no distension.     Palpations: Abdomen is soft. There is no mass.     Tenderness: There is no abdominal tenderness.  Musculoskeletal:        General: No tenderness. Normal range of motion.     Cervical back: Normal range of motion and neck supple.     Right lower leg: No edema.     Left lower leg: No edema.  Lymphadenopathy:     Cervical: No cervical adenopathy.  Skin:    General: Skin is warm and dry.     Findings: No erythema or rash.  Neurological:     Mental Status: He is alert.     Coordination: Coordination normal.  Psychiatric:        Behavior: Behavior normal.     (all labs ordered are listed, but only abnormal results are displayed) Labs Reviewed  BASIC METABOLIC PANEL WITH GFR - Abnormal; Notable for the following components:      Result Value   Glucose, Bld 143 (*)    All other components within normal limits  CBC  D-DIMER, QUANTITATIVE  TROPONIN I (HIGH SENSITIVITY)  TROPONIN I (HIGH SENSITIVITY)    EKG: EKG Interpretation Date/Time:  Thursday July 20 2023 12:34:16 EDT Ventricular Rate:  82 PR Interval:  179 QRS Duration:  91 QT Interval:  369 QTC Calculation: 431 R Axis:   5  Text Interpretation: Sinus rhythm Nonspecific T abnormalities, lateral leads Confirmed by Cleotilde Rogue ([id number]) on 07/20/2023 12:44:20 PM  Radiology: [facility] Chest 2 View Result Date: 07/20/2023 CLINICAL DATA:  One-week  history of intermittent chest pain. Several week history of shortness of breath EXAM: CHEST - 2 VIEW COMPARISON:  Chest radiograph dated 01/30/2022 FINDINGS: Normal lung volumes. No focal consolidations. No pleural effusion or pneumothorax. The heart size and mediastinal contours are within normal limits. No acute  osseous abnormality. IMPRESSION: No active cardiopulmonary disease. Electronically Signed   By: Limin  Xu M.D.   On: 07/20/2023 13:48     Procedures   Medications Ordered in the ED - No data to display                                  Medical Decision Making Amount and/or Complexity of Data Reviewed Labs: ordered. Radiology: ordered.    This patient presents to the ED for concern of chest pain, this involves an extensive number of treatment options, and is a complaint that carries with it a high risk of complications and morbidity.  The differential diagnosis includes coronary syndrome, pulmonary embolism, pneumothorax, this may just be musculoskeletal   Co morbidities / Chronic conditions that complicate the patient evaluation  Hypertension and diabetes   Additional history obtained:  Additional history obtained from EMR External records from outside source obtained and reviewed including medical record, including reviewing multiple prior EKGs which are unchanged from today.  He has been seen in the office for his history of cataracts, he has been seen for his history of TIAs with neurology as recently as July 2023   Lab Tests:  I Ordered, and personally interpreted labs.  The pertinent results include: Initial troponin is negative, D-dimer negative, CBC unremarkable and metabolic panel is reassuring   Imaging Studies ordered:  I ordered imaging studies including chest x-ray I independently visualized and interpreted imaging which showed no acute findings I agree with the radiologist interpretation   Cardiac Monitoring: / EKG:  The patient was maintained on a  cardiac monitor.  I personally viewed and interpreted the cardiac monitored which showed an underlying rhythm of: Normal sinus rhythm   Problem List / ED Course / Critical interventions / Medication management  Patient with ongoing mild chest pain, no acute findings on exam, first troponin essentially undetectable, second troponin pending Aspirin given prehospital I have reviewed the patients home medicines and have made adjustments as needed  anticipate discharge home if second troponin not rising with out pt cardiology f/u   Social Determinants of Health:  None   Test / Admission - Considered:  Second trop neg, stable for d/c      Final diagnoses:  Chest pain, unspecified type    ED Discharge Orders     None          Cleotilde Rogue, MD 07/20/23 1556    Cleotilde Rogue, MD 07/20/23 1606

## 2023-07-20 NOTE — ED Notes (Signed)
 Pt unsure how he will get back to group home, usually uses their transport but does not know how to contact them. Attempted to call emergency contact listed in chart, patient's son, no answer, VM full. Pt unsure of group home address. Left VM with SW to assist.

## 2023-08-06 ENCOUNTER — Encounter (HOSPITAL_COMMUNITY): Payer: Self-pay

## 2023-08-06 ENCOUNTER — Emergency Department (HOSPITAL_COMMUNITY)

## 2023-08-06 ENCOUNTER — Other Ambulatory Visit: Payer: Self-pay

## 2023-08-06 ENCOUNTER — Emergency Department (HOSPITAL_COMMUNITY)
Admission: EM | Admit: 2023-08-06 | Discharge: 2023-08-07 | Disposition: A | Discharge status: Home / Self Care | Attending: Emergency Medicine | Admitting: Emergency Medicine

## 2023-08-06 DIAGNOSIS — E1165 Type 2 diabetes mellitus with hyperglycemia: Secondary | ICD-10-CM | POA: Diagnosis not present

## 2023-08-06 DIAGNOSIS — R1031 Right lower quadrant pain: Secondary | ICD-10-CM | POA: Insufficient documentation

## 2023-08-06 DIAGNOSIS — R739 Hyperglycemia, unspecified: Secondary | ICD-10-CM

## 2023-08-06 DIAGNOSIS — Z7901 Long term (current) use of anticoagulants: Secondary | ICD-10-CM | POA: Insufficient documentation

## 2023-08-06 DIAGNOSIS — R109 Unspecified abdominal pain: Secondary | ICD-10-CM

## 2023-08-06 DIAGNOSIS — Z7982 Long term (current) use of aspirin: Secondary | ICD-10-CM | POA: Insufficient documentation

## 2023-08-06 DIAGNOSIS — I10 Essential (primary) hypertension: Secondary | ICD-10-CM | POA: Diagnosis not present

## 2023-08-06 DIAGNOSIS — Z794 Long term (current) use of insulin: Secondary | ICD-10-CM | POA: Insufficient documentation

## 2023-08-06 DIAGNOSIS — E119 Type 2 diabetes mellitus without complications: Secondary | ICD-10-CM | POA: Diagnosis not present

## 2023-08-06 DIAGNOSIS — Z79899 Other long term (current) drug therapy: Secondary | ICD-10-CM | POA: Insufficient documentation

## 2023-08-06 HISTORY — DX: Unspecified intellectual disabilities: F79

## 2023-08-06 LAB — CBC WITH DIFFERENTIAL/PLATELET
Abs Immature Granulocytes: 0.02 K/uL (ref 0.00–0.07)
Basophils Absolute: 0 K/uL (ref 0.0–0.1)
Basophils Relative: 0 %
Eosinophils Absolute: 0.1 K/uL (ref 0.0–0.5)
Eosinophils Relative: 2 %
HCT: 37.4 % — ABNORMAL LOW (ref 39.0–52.0)
Hemoglobin: 12.8 g/dL — ABNORMAL LOW (ref 13.0–17.0)
Immature Granulocytes: 0 %
Lymphocytes Relative: 26 %
Lymphs Abs: 1.8 K/uL (ref 0.7–4.0)
MCH: 31.4 pg (ref 26.0–34.0)
MCHC: 34.2 g/dL (ref 30.0–36.0)
MCV: 91.9 fL (ref 80.0–100.0)
Monocytes Absolute: 0.5 K/uL (ref 0.1–1.0)
Monocytes Relative: 8 %
Neutro Abs: 4.5 K/uL (ref 1.7–7.7)
Neutrophils Relative %: 64 %
Platelets: 249 K/uL (ref 150–400)
RBC: 4.07 MIL/uL — ABNORMAL LOW (ref 4.22–5.81)
RDW: 13.2 % (ref 11.5–15.5)
WBC: 6.9 K/uL (ref 4.0–10.5)
nRBC: 0 % (ref 0.0–0.2)

## 2023-08-06 LAB — BLOOD GAS, VENOUS
Acid-base deficit: 1.7 mmol/L (ref 0.0–2.0)
Bicarbonate: 23 mmol/L (ref 20.0–28.0)
Drawn by: 7049
O2 Saturation: 93.9 %
Patient temperature: 36.9
pCO2, Ven: 38 mmHg — ABNORMAL LOW (ref 44–60)
pH, Ven: 7.39 (ref 7.25–7.43)
pO2, Ven: 64 mmHg — ABNORMAL HIGH (ref 32–45)

## 2023-08-06 LAB — URINALYSIS, ROUTINE W REFLEX MICROSCOPIC
Bacteria, UA: NONE SEEN
Bilirubin Urine: NEGATIVE
Glucose, UA: >=500 mg/dL — AB
Hgb urine dipstick: NEGATIVE
Ketones, ur: 5 mg/dL — AB
Leukocytes,Ua: NEGATIVE
Nitrite: NEGATIVE
Protein, ur: NEGATIVE mg/dL
Specific Gravity, Urine: 1.024 (ref 1.005–1.030)
pH: 5 (ref 5.0–8.0)

## 2023-08-06 LAB — CBG MONITORING, ED
Glucose-Capillary: 412 mg/dL — ABNORMAL HIGH (ref 70–99)
Glucose-Capillary: 333 mg/dL — ABNORMAL HIGH (ref 70–99)

## 2023-08-06 LAB — BASIC METABOLIC PANEL WITH GFR
Anion gap: 14 (ref 5–15)
BUN: 22 mg/dL (ref 8–23)
CO2: 19 mmol/L — ABNORMAL LOW (ref 22–32)
Calcium: 8.9 mg/dL (ref 8.9–10.3)
Chloride: 103 mmol/L (ref 98–111)
Creatinine, Ser: 1.26 mg/dL — ABNORMAL HIGH (ref 0.61–1.24)
GFR, Estimated: >60 mL/min (ref >60)
Glucose, Bld: 394 mg/dL — ABNORMAL HIGH (ref 70–99)
Potassium: 3.9 mmol/L (ref 3.5–5.1)
Sodium: 136 mmol/L (ref 135–145)

## 2023-08-06 LAB — BETA-HYDROXYBUTYRIC ACID: Beta-Hydroxybutyric Acid: 0.37 mmol/L — ABNORMAL HIGH (ref 0.05–0.27)

## 2023-08-06 MED ORDER — MORPHINE SULFATE (PF) 4 MG/ML IV SOLN
4.0000 mg | Freq: Once | INTRAVENOUS | Status: AC
  Administered 2023-08-06: 4 mg via INTRAVENOUS
  Filled 2023-08-06: qty 1

## 2023-08-06 MED ORDER — ONDANSETRON HCL 4 MG/2ML IJ SOLN
4.0000 mg | Freq: Once | INTRAMUSCULAR | Status: AC
  Administered 2023-08-06: 4 mg via INTRAVENOUS
  Filled 2023-08-06: qty 2

## 2023-08-06 MED ORDER — SODIUM CHLORIDE 0.9 % IV BOLUS
1000.0000 mL | Freq: Once | INTRAVENOUS | Status: AC
  Administered 2023-08-06: 1000 mL via INTRAVENOUS

## 2023-08-06 MED ORDER — INSULIN ASPART 100 UNIT/ML IJ SOLN
5.0000 [IU] | Freq: Once | INTRAMUSCULAR | Status: AC
  Administered 2023-08-06: 5 [IU] via SUBCUTANEOUS
  Filled 2023-08-06: qty 1

## 2023-08-06 NOTE — ED Notes (Signed)
 Attempted to call patient's son for transportation home. No answer; voicemail left to call back

## 2023-08-06 NOTE — ED Notes (Signed)
 Patient transported to CT

## 2023-08-06 NOTE — ED Provider Notes (Signed)
 [place] EMERGENCY DEPARTMENT AT [facility] Provider Note   CSN: [id number] Arrival date & time: 08/06/23  2008     Patient presents with: Flank Pain   Justin Vasquez is a 64 y.o. male.    Flank Pain Associated symptoms include abdominal pain. Pertinent negatives include no chest pain, no headaches and no shortness of breath.        Justin Vasquez is a 63 y.o. male with past medical history of hypertension, type 2 diabetes and intellectual disability.  Here from [facility] for evaluation of right flank pain.  Patient states he has been having pain of his right flank that radiates to his right lower abdomen and groin for several days.  No known injury.  Endorses history of constipation he is unsure when his last bowel movement was.  He denies any dysuria or bloody urine.  He was noted to have elevated blood sugar by EMS.  He was given an unknown amount of IV fluids prior to arrival.  Patient states that he takes Amaryl, metformin and Lantus  at night, but states that since he arrived at the group home he has not had his Lantus  but states his medications are supposed to arrive sometime this week.  History of kidney stones, denies any chest pain, shortness of breath, fever or chills.     Prior to Admission medications   Medication Sig Start Date End Date Taking? Authorizing Provider  albuterol  (VENTOLIN  HFA) 108 (90 Base) MCG/ACT inhaler     [provider]  amLODipine (NORVASC) 10 MG tablet Take 10 mg by mouth daily.    [provider]  aspirin 325 MG tablet Take 325 mg by mouth daily.    [provider]  calcium-vitamin D (OSCAL WITH D) 500-200 MG-UNIT tablet Take 2 tablets by mouth. 1000 units a day; take one a day    [provider]  carvedilol (COREG) 12.5 MG tablet Take 12.5 mg by mouth 2 (two) times daily with a meal.    [provider]  clopidogrel (PLAVIX) 75 MG tablet Take 75 mg by mouth daily.     [provider]  Cyanocobalamin (VITAMIN B 12) 250 MCG LOZG Take 250 mcg by mouth daily. 2 tablets daily.    [provider]  fluticasone (FLONASE) 50 MCG/ACT nasal spray Place into both nostrils daily.    [provider]  glimepiride (AMARYL) 4 MG tablet Take 4 mg by mouth daily with breakfast.    [provider]  hydrochlorothiazide (HYDRODIURIL) 12.5 MG tablet Take 12.5 mg by mouth daily.    [provider]  insulin  glargine (LANTUS ) 100 UNIT/ML injection Inject 38 Units into the skin daily.    [provider]  Insulin  Pen Needle 32G X 8 MM MISC 30 each by Does not apply route at bedtime. 06/01/23   Yolande Lamar BROCKS, MD  metFORMIN (GLUCOPHAGE) 500 MG tablet Take 500 mg by mouth 2 (two) times daily with a meal.    [provider]  Multiple Vitamins-Minerals (THEREMS M PO) Take by mouth.    [provider]  OLMESARTAN MEDOXOMIL PO Take 20 mg by mouth. Take 1 1/2 daily    [provider]  ondansetron  (ZOFRAN ) 4 MG tablet Take 4 mg by mouth every 8 (eight) hours as needed for nausea or vomiting.    [provider]  ondansetron  (ZOFRAN ) 4 MG tablet Take 1 tablet (4 mg total) by mouth every 6 (six) hours. 01/30/22  Beatty, Celeste A, PA-C  pantoprazole (PROTONIX) 40 MG tablet Take 40 mg by mouth daily.    [provider]  ranitidine (ZANTAC) 150 MG capsule Take 150 mg by mouth 2 (two) times daily.    [provider]  rosuvastatin (CRESTOR) 20 MG tablet Take 20 mg by mouth daily.    [provider]    Allergies: Lisinopril-hydrochlorothiazide    Review of Systems  Constitutional:  Negative for chills and fever.  Respiratory:  Negative for cough and shortness of breath.   Cardiovascular:  Negative for chest pain.  Gastrointestinal:  Positive for abdominal pain, constipation and vomiting.  Genitourinary:  Positive for flank pain. Negative for difficulty urinating, dysuria and  hematuria.  Skin:  Negative for rash.  Neurological:  Negative for dizziness, weakness and headaches.  Psychiatric/Behavioral:  Negative for confusion.     Updated Vital Signs BP 117/72 (BP Location: Right Arm)   Pulse 76   Temp 98.5 F (36.9 C) (Oral)   Resp 15   Ht 5' 3 (1.6 m)   Wt 73 kg   SpO2 95%   BMI 28.52 kg/m   Physical Exam Vitals and nursing note reviewed.  Constitutional:      General: He is not in acute distress.    Appearance: Normal appearance. He is not ill-appearing.  HENT:     Mouth/Throat:     Mouth: Mucous membranes are moist.  Cardiovascular:     Rate and Rhythm: Normal rate and regular rhythm.     Pulses: Normal pulses.  Pulmonary:     Effort: Pulmonary effort is normal.  Abdominal:     Palpations: Abdomen is soft.     Tenderness: There is no abdominal tenderness. There is no right CVA tenderness or left CVA tenderness.  Musculoskeletal:        General: Tenderness present. Normal range of motion.     Right lower leg: No edema.     Left lower leg: No edema.     Comments: Mild tenderness to palpation of the right lower thoracic/upper lumbar paraspinal muscles.  No midline tenderness.  Hip flexors and extensors intact  Skin:    General: Skin is warm.     Capillary Refill: Capillary refill takes less than 2 seconds.     Findings: No erythema.  Neurological:     General: No focal deficit present.     Mental Status: He is alert.     Sensory: No sensory deficit.     Motor: No weakness.     (all labs ordered are listed, but only abnormal results are displayed) Labs Reviewed  CBC WITH DIFFERENTIAL/PLATELET - Abnormal; Notable for the following components:      Result Value   RBC 4.07 (*)    Hemoglobin 12.8 (*)    HCT 37.4 (*)    All other components within normal limits  BASIC METABOLIC PANEL WITH GFR - Abnormal; Notable for the following components:   CO2 19 (*)    Glucose, Bld 394 (*)    Creatinine, Ser 1.26 (*)    All other components  within normal limits  URINALYSIS, ROUTINE W REFLEX MICROSCOPIC - Abnormal; Notable for the following components:   Color, Urine STRAW (*)    Glucose, UA >=500 (*)    Ketones, ur 5 (*)    All other components within normal limits  BLOOD GAS, VENOUS - Abnormal; Notable for the following components:   pCO2, Ven 38 (*)    pO2, Ven 64 (*)  All other components within normal limits  CBG MONITORING, ED - Abnormal; Notable for the following components:   Glucose-Capillary 412 (*)    All other components within normal limits  CBG MONITORING, ED - Abnormal; Notable for the following components:   Glucose-Capillary 333 (*)    All other components within normal limits  BETA-HYDROXYBUTYRIC ACID    EKG: None  Radiology: CT Renal Stone Study Result Date: 08/06/2023 CLINICAL DATA:  Back and flank pain.  Constipation. EXAM: CT ABDOMEN AND PELVIS WITHOUT CONTRAST TECHNIQUE: Multidetector CT imaging of the abdomen and pelvis was performed following the standard protocol without IV contrast. RADIATION DOSE REDUCTION: This exam was performed according to the departmental dose-optimization program which includes automated exposure control, adjustment of the mA and/or kV according to patient size and/or use of iterative reconstruction technique. COMPARISON:  None Available. FINDINGS: Lower chest: The lung bases are clear of acute process. No pleural effusion or pulmonary lesions. The heart is normal in size. No pericardial effusion. The distal esophagus and aorta are unremarkable. Hepatobiliary: No hepatic lesions are identified without contrast. No intrahepatic biliary dilatation. The gallbladder is grossly normal. No common bile duct dilatation. Pancreas: No mass, inflammation or ductal dilatation. Spleen: Normal in size without focal abnormality. Adrenals/Urinary Tract: The adrenal glands are normal. No renal, ureteral or bladder calculi or mass. No hydroureteronephrosis. Stomach/Bowel: The stomach is  moderately distended with fluid and food and gas. The duodenum, small and colon are grossly normal without oral contrast. The terminal ileum is normal. The appendix is normal. Descending and sigmoid colon diverticulosis. No significant stool burden. Vascular/Lymphatic: The aorta and iliac arteries are normal in caliber. Minimal scattered atherosclerotic calcifications. No mesenteric or retroperitoneal mass or adenopathy. There is moderate hazy interstitial changes in the root of the small bowel mesentery along with small scattered lymph nodes typically seen with mesenteritis or panniculitis which is a benign self-limiting process. Reproductive: The prostate gland and seminal vesicles are unremarkable. Other: No pelvic mass or adenopathy. No free pelvic fluid collections. No inguinal mass or adenopathy. No abdominal wall hernia or subcutaneous lesions. 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 No bone lesions or fractures. IMPRESSION: 1. No renal, ureteral or bladder calculi or mass. No hydroureteronephrosis. 2. No acute abdominal/pelvic findings, mass lesions or adenopathy. 3. Moderate hazy interstitial changes in the root of the small bowel mesentery along with small scattered lymph nodes typically seen with mesenteritis or panniculitis which is a benign self-limiting process. 4. 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 Electronically Signed   By: MYRTIS Stammer M.D.   On: 08/06/2023 21:26     Procedures   Medications Ordered in the ED  sodium chloride  0.9 % bolus 1,000 mL (0 mLs Intravenous Stopped 08/06/23 2215)  morphine  (PF) 4 MG/ML injection 4 mg (4 mg Intravenous Given 08/06/23 2117)  ondansetron  (ZOFRAN ) injection 4 mg (4 mg Intravenous Given 08/06/23 2116)                                    Medical Decision Making Patient  here from [facility] for evaluation of right flank pain and elevated blood sugar.  Patient states that he does not currently have his Lantus  which he takes nightly states his medication is all the way.  He had  some nausea vomiting earlier today without diarrhea.  He is concerned he is constipated.  Describes flank pain radiating to his lower abdomen and groin area.  Denies any dysuria symptoms or history of previous kidney stones  Patient nontoxic-appearing mucous membranes are moist.  No active vomiting, flank pain possibly related to kidney stone, constipation, SBO, acute appendicitis also considered but felt less likely Hyperglycemia felt to be secondary to his diabetes and lack of his routine medications.  DKA, HSS also considered  Amount and/or Complexity of Data Reviewed Labs: ordered.    Details: No leukocytosis, initial CBG 412.  Chemistries showed blood sugar of 394 potassium sodium unremarkable .  Bicarb 19, venous blood gas shows bicarb of 23 and pCO2 38 and his pH was 7.39.  BHA not significantly elevated to suggest DKA.  Urinalysis without evidence of hematuria or infection Radiology: ordered.    Details: CT renal stone study shows no evidence of bladder or ureteral or renal stone or mass.  Normal-appearing appendix.  No acute abdominal pelvic findings ECG/medicine tests: ordered.    Details: EKG shows sinus rhythm Discussion of management or test interpretation with external provider(s): On recheck, patient resting comfortably.  Flank pain has improved.  Blood sugar improving after IV fluids, initially 412 now 333.  Was given dose of short acting insulin  as well.  No vomiting during ER stay, patient reports feeling better.  He appears appropriate for discharge back to facility and he will follow-up closely outpatient with his PCP this week.  Return precautions were also discussed  Risk Prescription drug management.        Final diagnoses:  Flank pain   Hyperglycemia    ED Discharge Orders     None          Herlinda Madelin RIGGERS 08/06/23 2337    Cleotilde Rogue, MD 08/07/23 [phone]

## 2023-08-06 NOTE — Discharge Instructions (Signed)
 Your blood sugar this evening was elevated you were given medication to correct this.  Please monitor your blood sugar closely.  Your CT scan this evening did not show evidence of constipation or kidney stone.  Please follow-up with your primary care provider this week for recheck return to the emergency department if you develop any new or worsening symptoms.

## 2023-08-06 NOTE — ED Triage Notes (Signed)
 Patient from [facility] group [facility] for back pain that started a couple of days ago. Patient reports constipation; last BM has been a while. Patient denies any urinary changes. Patient was administered 975mg  of Tylenol at group home. EMS reports a BS of 359; placed an 18G IV in the LAC and administered NS. Per EMS, facility does not have patient's insulin  yet due to him just moving in, but meds are on the way. Upon arrival to ER, patient is alert and oriented, ambu.

## 2023-08-07 NOTE — ED Notes (Signed)
 Group Home returned call.  A driver will be here around 0800 to pick patient up.  No reports needed at this time

## 2023-08-25 ENCOUNTER — Ambulatory Visit (INDEPENDENT_AMBULATORY_CARE_PROVIDER_SITE_OTHER): Payer: Self-pay | Admitting: Podiatry

## 2023-08-25 DIAGNOSIS — Z91199 Patient's noncompliance with other medical treatment and regimen due to unspecified reason: Secondary | ICD-10-CM

## 2023-08-25 NOTE — Progress Notes (Signed)
 1. No-show for appointment

## 2023-11-05 DIAGNOSIS — R079 Chest pain, unspecified: Secondary | ICD-10-CM | POA: Insufficient documentation

## 2023-11-05 NOTE — Progress Notes (Unsigned)
 Cardiology Office Note  Date:  11/06/2023   ID:  Justin Vasquez, DOB [birth year], MRN [id number]  PCP:  Katrinka Aquas, MD   Chief Complaint  Patient presents with   New Patient (Initial Visit)    Ref by Dr. Redell Pinal from [facility] ER; chest pain. Patient c/o shortness of breath and chest pain.     HPI:  Justin Vasquez is a 63 y.o. male with past medical history of: Past Medical History:  Diagnosis Date   Diabetes mellitus without complication (HCC)    Hypercholesterolemia    Hypertension    Intellectual disability   Who presents by referral from Dr. Redell Pinal for chest pain  Followed by [facility], Dr. Katrinka Lives in a family group home with 6 others  Reports over the summer he developed Aching in upper epigastric  Comes on with twisting and turning  Seen in the emergency room July 20, 2023 for chest pain on Plavix,  In the ER he reported having chest pain going on for 1 week, seems to be in the upper chest, radiation to the back, worse with taking a deep breath and not associated with nausea fevers chills or coughing.   Cardiac workup negative  Seen back in the ER August 06, 2023 flank pain CT renal stone study performed  Reports that he likes to go walking every day, walks up the hill and back <1 mile Balance off, sometimes falls Some chest pain when walking up hill, feels like a aching in the chest  Lab work reviewed Markedly elevated glucose levels greater than 350 Creatinine 1.26 A1c 9.4 in 2023  EKG personally reviewed by myself on todays visit EKG Interpretation Date/Time:  Monday November 06 2023 09:49:13 EST Ventricular Rate:  87 PR Interval:  174 QRS Duration:  92 QT Interval:  366 QTC Calculation: 440 R Axis:   -19  Text Interpretation: Normal sinus rhythm Normal ECG When compared with ECG of 06-Aug-2023 20:54,  No significant change was found  Confirmed by Perla Lye [phone]) on 11/06/2023 9:57:55 AM    PMH:    has a past medical history of Diabetes mellitus without complication (HCC), Hypercholesterolemia, Hypertension, and Intellectual disability.   PSH:    Past Surgical History:  Procedure Laterality Date   COLONOSCOPY WITH PROPOFOL  N/A 03/17/2020   Procedure: COLONOSCOPY WITH PROPOFOL ;  Surgeon: Maryruth Ole ONEIDA, MD;  Location: [facility] ENDOSCOPY;  Service: Endoscopy;  Laterality: N/A;    Current Outpatient Medications  Medication Sig Dispense Refill   albuterol  (VENTOLIN  HFA) 108 (90 Base) MCG/ACT inhaler      amLODipine (NORVASC) 10 MG tablet Take 10 mg by mouth daily.     aspirin 325 MG tablet Take 325 mg by mouth daily.     calcium-vitamin D (OSCAL WITH D) 500-200 MG-UNIT tablet Take 2 tablets by mouth. 1000 units a day; take one a day     carvedilol (COREG) 12.5 MG tablet Take 12.5 mg by mouth 2 (two) times daily with a meal.     clopidogrel (PLAVIX) 75 MG tablet Take 75 mg by mouth daily.     Cyanocobalamin (VITAMIN B 12) 250 MCG LOZG Take 250 mcg by mouth daily. 2 tablets daily.     fluticasone (FLONASE) 50 MCG/ACT nasal spray Place into both nostrils daily.     glimepiride (AMARYL) 4 MG tablet Take 4 mg by mouth daily with breakfast.     hydrochlorothiazide (HYDRODIURIL) 12.5 MG tablet Take 12.5 mg by mouth daily.  insulin  glargine (LANTUS ) 100 UNIT/ML injection Inject 38 Units into the skin daily.     Insulin  Pen Needle 32G X 8 MM MISC 30 each by Does not apply route at bedtime. 90 each 3   metFORMIN (GLUCOPHAGE) 500 MG tablet Take 500 mg by mouth 2 (two) times daily with a meal.     Multiple Vitamins-Minerals (THEREMS M Vasquez) Take by mouth.     OLMESARTAN MEDOXOMIL Vasquez Take 20 mg by mouth. Take 1 1/2 daily     ondansetron  (ZOFRAN ) 4 MG tablet Take 4 mg by mouth every 8 (eight) hours as needed for nausea or vomiting.     pantoprazole (PROTONIX) 40 MG tablet Take 40 mg by mouth daily.     ranitidine (ZANTAC) 150 MG capsule Take 150 mg by mouth 2 (two) times daily.     rosuvastatin  (CRESTOR) 20 MG tablet Take 20 mg by mouth daily.     ondansetron  (ZOFRAN ) 4 MG tablet Take 1 tablet (4 mg total) by mouth every 6 (six) hours. (Patient not taking: Reported on 11/06/2023) 12 tablet 0   No current facility-administered medications for this visit.     Allergies:   Lisinopril-hydrochlorothiazide and Penicillins   Social History:  The patient  reports that he has never smoked. He quit smokeless tobacco use about 9 years ago.  His smokeless tobacco use included chew. He reports that he does not currently use alcohol. He reports that he does not use drugs.   Family History:   family history includes Alzheimer's disease in his father.    Review of Systems: Review of Systems  Constitutional: Negative.   HENT: Negative.    Respiratory: Negative.    Cardiovascular:  Positive for chest pain.  Gastrointestinal: Negative.   Musculoskeletal: Negative.   Neurological: Negative.   Psychiatric/Behavioral: Negative.    All other systems reviewed and are negative.   PHYSICAL EXAM: VS:  BP 120/60 (BP Location: Right Arm, Patient Position: Sitting, Cuff Size: Normal)   Pulse 87   Ht 5' 3 (1.6 m)   Wt 157 lb 4 oz (71.3 kg)   SpO2 98%   BMI 27.86 kg/m  , BMI Body mass index is 27.86 kg/m. GEN: Well nourished, well developed, in no acute distress HEENT: normal Neck: no JVD, carotid bruits, or masses Cardiac: RRR; no murmurs, rubs, or gallops,no edema  Respiratory:  clear to auscultation bilaterally, normal work of breathing GI: soft, nontender, nondistended, + BS MS: no deformity or atrophy Skin: warm and dry, no rash Neuro:  Strength and sensation are intact Psych: euthymic mood, full affect  Recent Labs: 08/06/2023: BUN 22; Creatinine, Ser 1.26; Hemoglobin 12.8; Platelets 249; Potassium 3.9; Sodium 136    Lipid Panel No results found for: CHOL, HDL, LDLCALC, TRIG    Wt Readings from Last 3 Encounters:  11/06/23 157 lb 4 oz (71.3 kg)  08/06/23 161 lb (73 kg)   07/20/23 160 lb 0.9 oz (72.6 kg)       ASSESSMENT AND PLAN:  Problem List Items Addressed This Visit     Chest pain of uncertain etiology - Primary   Relevant Orders   EKG 12-Lead (Completed)   DOE (dyspnea on exertion)   Relevant Orders   EKG 12-Lead (Completed)   Chest pain Etiology unclear, reports having chest ache when he walks up a hill on his daily walks Unable to exclude angina Recommend further ischemic workup Suggest Lexiscan Myoview Would avoid cardiac CTA given mild renal dysfunction in the setting of  poorly controlled diabetes  Epigastric discomfort Also reports having some bilateral epigastric discomfort, flank pain if he turns the wrong way Previously seen in the ER August 2025, CT scan with no kidney stones  Aortic atherosclerosis CT scan images pulled up and reviewed, mild disease noted Cholesterol and diabetes management managed by primary care.  Goal LDL less than 70 Continue Crestor 20  Essential hypertension Blood pressure is well controlled on today's visit. No changes made to the medications.  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 [facility] [place], [place] [phone]

## 2023-11-06 ENCOUNTER — Ambulatory Visit: Admitting: Podiatry

## 2023-11-06 ENCOUNTER — Emergency Department: Admission: EM | Admit: 2023-11-06

## 2023-11-06 ENCOUNTER — Ambulatory Visit: Attending: Cardiovascular Disease | Admitting: Cardiovascular Disease

## 2023-11-06 ENCOUNTER — Encounter: Payer: Self-pay | Admitting: Cardiovascular Disease

## 2023-11-06 VITALS — BP 120/60 | HR 87 | Ht 63.0 in | Wt 157.2 lb

## 2023-11-06 DIAGNOSIS — Z91199 Patient's noncompliance with other medical treatment and regimen due to unspecified reason: Secondary | ICD-10-CM

## 2023-11-06 DIAGNOSIS — R0609 Other forms of dyspnea: Secondary | ICD-10-CM | POA: Diagnosis not present

## 2023-11-06 DIAGNOSIS — R079 Chest pain, unspecified: Secondary | ICD-10-CM | POA: Diagnosis not present

## 2023-11-06 DIAGNOSIS — I209 Angina pectoris, unspecified: Secondary | ICD-10-CM

## 2023-11-06 NOTE — Patient Instructions (Addendum)
 Medication Instructions:  No changes  If you need a refill on your cardiac medications before your next appointment, please call your pharmacy.   Lab work: No new labs needed  Testing/Procedures:  Your provider has ordered a Lexiscan/ Exercise Myoview Stress test. This will take place at [facility]. Please report to the [facility] medical mall entrance. The volunteers at the first desk will direct you where to go.  [facility] MYOVIEW  Your provider has ordered a Stress Test with nuclear imaging. The purpose of this test is to evaluate the blood supply to your heart muscle. This procedure is referred to as a Non-Invasive Stress Test. This is because other than having an IV started in your vein, nothing is inserted or invades your body. Cardiac stress tests are done to find areas of poor blood flow to the heart by determining the extent of coronary artery disease (CAD). Some patients exercise on a treadmill, which naturally increases the blood flow to your heart, while others who are unable to walk on a treadmill due to physical limitations will have a pharmacologic/chemical stress agent called Lexiscan . This medicine will mimic walking on a treadmill by temporarily increasing your coronary blood flow.   Please note: these test may take anywhere between 2-4 hours to complete  How to prepare for your Myoview test:  Nothing to eat for 6 hours prior to the test No caffeine for 24 hours prior to test No smoking 24 hours prior to test. Your medication may be taken with water. Hold insulin  the morning of test.  If your doctor stopped a medication because of this test, do not take that medication. Ladies, please do not wear dresses.  Skirts or pants are appropriate. Please wear a short sleeve shirt. No perfume, cologne or lotion. Wear comfortable walking shoes. No heels!   PLEASE NOTIFY THE OFFICE AT LEAST 24 HOURS IN ADVANCE IF YOU ARE UNABLE TO KEEP YOUR APPOINTMENT.  [phone] AND  PLEASE NOTIFY  NUCLEAR MEDICINE AT [facility] AT LEAST 24 HOURS IN ADVANCE IF YOU ARE UNABLE TO KEEP YOUR APPOINTMENT. [phone]   Follow-Up: At [facility], you and your health needs are our priority.  As part of our continuing mission to provide you with exceptional heart care, we have created designated Provider Care Teams.  These Care Teams include your primary Cardiologist (physician) and Advanced Practice Providers (APPs -  Physician Assistants and Nurse Practitioners) who all work together to provide you with the care you need, when you need it.  You will need a follow up appointment as needed  Providers on your designated Care Team:   Lonni Meager, NP Bernardino Bring, PA-C Cadence [place], [place]  COVID-19 Vaccine Information can be found at: [email or web address] For questions related to vaccine distribution or appointments, please email [email or web address] or call [phone].

## 2023-11-06 NOTE — Progress Notes (Signed)
 No show # 2

## 2023-11-13 ENCOUNTER — Ambulatory Visit
Admission: RE | Admit: 2023-11-13 | Discharge: 2023-11-13 | Disposition: A | Discharge status: Home / Self Care | Source: Ambulatory Visit | Attending: Cardiovascular Disease | Admitting: Cardiovascular Disease

## 2023-11-13 DIAGNOSIS — R079 Chest pain, unspecified: Secondary | ICD-10-CM | POA: Insufficient documentation

## 2023-11-13 DIAGNOSIS — I209 Angina pectoris, unspecified: Secondary | ICD-10-CM | POA: Insufficient documentation

## 2023-11-15 ENCOUNTER — Ambulatory Visit: Admission: RE | Admit: 2023-11-15

## 2023-11-20 ENCOUNTER — Ambulatory Visit: Attending: Cardiovascular Disease

## 2023-11-22 ENCOUNTER — Other Ambulatory Visit

## 2023-12-01 ENCOUNTER — Other Ambulatory Visit

## 2023-12-04 ENCOUNTER — Ambulatory Visit
Admission: RE | Admit: 2023-12-04 | Discharge: 2023-12-04 | Disposition: A | Discharge status: Home / Self Care | Source: Ambulatory Visit | Attending: Cardiovascular Disease | Admitting: Cardiovascular Disease

## 2023-12-04 DIAGNOSIS — R079 Chest pain, unspecified: Secondary | ICD-10-CM | POA: Diagnosis not present

## 2023-12-04 DIAGNOSIS — I209 Angina pectoris, unspecified: Secondary | ICD-10-CM | POA: Insufficient documentation

## 2023-12-04 LAB — NM MYOCAR MULTI W/SPECT W/WALL MOTION / EF
LV dias vol: 65 mL (ref 62–150)
LV sys vol: 10 mL (ref 4.2–5.8)
Nuc Stress EF: 85 %
Peak HR: 99 {beats}/min
Percent HR: 63 %
Rest HR: 75 {beats}/min
Rest Nuclear Isotope Dose: 10.2 mCi
SDS: 3
SRS: 11
SSS: 3
ST Depression (mm): 0.5 mm
Stress Nuclear Isotope Dose: 32.8 mCi
TID: 1.16

## 2023-12-04 MED ORDER — TECHNETIUM TC 99M TETROFOSMIN IV KIT
32.7900 | PACK | Freq: Once | INTRAVENOUS | Status: AC | PRN
  Administered 2023-12-04: 32.79 via INTRAVENOUS

## 2023-12-04 MED ORDER — REGADENOSON 0.4 MG/5ML IV SOLN
0.4000 mg | Freq: Once | INTRAVENOUS | Status: AC
  Administered 2023-12-04: 0.4 mg via INTRAVENOUS

## 2023-12-04 MED ORDER — TECHNETIUM TC 99M TETROFOSMIN IV KIT
10.0000 | PACK | Freq: Once | INTRAVENOUS | Status: AC | PRN
  Administered 2023-12-04: 10.17 via INTRAVENOUS

## 2023-12-05 ENCOUNTER — Ambulatory Visit: Payer: Self-pay | Admitting: Cardiovascular Disease

## 2023-12-05 ENCOUNTER — Encounter: Payer: Self-pay | Admitting: Emergency Medicine

## 2024-01-10 ENCOUNTER — Ambulatory Visit: Admitting: Podiatry

## 2024-01-25 ENCOUNTER — Ambulatory Visit: Admitting: Podiatry

## 2024-01-25 DIAGNOSIS — B351 Tinea unguium: Secondary | ICD-10-CM | POA: Diagnosis not present

## 2024-01-25 DIAGNOSIS — M79675 Pain in left toe(s): Secondary | ICD-10-CM | POA: Diagnosis not present

## 2024-01-25 DIAGNOSIS — M79674 Pain in right toe(s): Secondary | ICD-10-CM | POA: Diagnosis not present

## 2024-01-25 NOTE — Progress Notes (Signed)
"  °  Subjective:  Patient ID: Justin Vasquez, male    DOB: 07-22-1960,  MRN: [id number]  Chief Complaint  Patient presents with   Nail Problem    Nail trim    64 y.o. male returns for the above complaint.  Patient presents with thickened elongated dystrophic mycotic toenails x 10 mild pain on palpation arch with ambulation worse with pressure patient would like to have it debrided down unable to do themselves.  Objective:  There were no vitals filed for this visit. Podiatric Exam: Vascular: dorsalis pedis and posterior tibial pulses are palpable bilateral. Capillary return is immediate. Temperature gradient is WNL. Skin turgor WNL  Sensorium: Normal Semmes Weinstein monofilament test. Normal tactile sensation bilaterally. Nail Exam: Pt has thick disfigured discolored nails with subungual debris noted bilateral entire nail hallux through fifth toenails.  Pain on palpation to the nails. Ulcer Exam: There is no evidence of ulcer or pre-ulcerative changes or infection. Orthopedic Exam: Muscle tone and strength are WNL. No limitations in general ROM. No crepitus or effusions noted.  Skin: No Porokeratosis. No infection or ulcers    Assessment & Plan:   1. Pain due to onychomycosis of toenails of both feet     Patient was evaluated and treated and all questions answered.  Onychomycosis with pain  -Nails palliatively debrided as below. -Educated on self-care  Procedure: Nail Debridement Rationale: pain  Type of Debridement: manual, sharp debridement. Instrumentation: Nail nipper, rotary burr. Number of Nails: 10  Procedures and Treatment: Consent by patient was obtained for treatment procedures. The patient understood the discussion of treatment and procedures well. All questions were answered thoroughly reviewed. Debridement of mycotic and hypertrophic toenails, 1 through 5 bilateral and clearing of subungual debris. No ulceration, no infection noted.  Return Visit-Office Procedure:  Patient instructed to return to the office for a follow up visit 3 months for continued evaluation and treatment.  Franky Blanch, DPM    Return in about 3 months (around 04/24/2024) for RFC.  "

## 2024-04-25 ENCOUNTER — Ambulatory Visit: Admitting: Podiatry
